# Patient Record
Sex: Female | Born: 1955
Health system: Southern US, Community
[De-identification: ages and names within clinical notes are randomized; demographics above are authoritative.]

## PROBLEM LIST (undated history)

## (undated) DIAGNOSIS — D219 Benign neoplasm of connective and other soft tissue, unspecified: Secondary | ICD-10-CM

## (undated) DIAGNOSIS — Z98811 Dental restoration status: Secondary | ICD-10-CM

## (undated) DIAGNOSIS — T7840XA Allergy, unspecified, initial encounter: Secondary | ICD-10-CM

## (undated) DIAGNOSIS — R7303 Prediabetes: Secondary | ICD-10-CM

## (undated) DIAGNOSIS — R011 Cardiac murmur, unspecified: Secondary | ICD-10-CM

## (undated) DIAGNOSIS — E785 Hyperlipidemia, unspecified: Secondary | ICD-10-CM

## (undated) DIAGNOSIS — D649 Anemia, unspecified: Secondary | ICD-10-CM

## (undated) DIAGNOSIS — D170 Benign lipomatous neoplasm of skin and subcutaneous tissue of head, face and neck: Secondary | ICD-10-CM

## (undated) HISTORY — DX: Allergy, unspecified, initial encounter: T78.40XA

## (undated) HISTORY — DX: Benign neoplasm of connective and other soft tissue, unspecified: D21.9

## (undated) HISTORY — DX: Cardiac murmur, unspecified: R01.1

## (undated) HISTORY — DX: Hyperlipidemia, unspecified: E78.5

## (undated) HISTORY — PX: CERVICAL POLYPECTOMY: SHX88

## (undated) HISTORY — PX: CHOLECYSTECTOMY: SHX55

## (undated) HISTORY — PX: COLONOSCOPY: SHX174

---

## 1988-08-03 HISTORY — PX: TUBAL LIGATION: SHX77

## 1999-04-01 ENCOUNTER — Other Ambulatory Visit: Admission: RE | Admit: 1999-04-01 | Discharge: 1999-04-01 | Payer: Self-pay | Admitting: Obstetrics and Gynecology

## 1999-08-18 ENCOUNTER — Encounter: Admission: RE | Admit: 1999-08-18 | Discharge: 1999-08-18 | Payer: Self-pay | Admitting: Obstetrics and Gynecology

## 1999-08-18 ENCOUNTER — Encounter: Payer: Self-pay | Admitting: Obstetrics and Gynecology

## 2000-12-08 ENCOUNTER — Encounter: Payer: Self-pay | Admitting: Obstetrics and Gynecology

## 2000-12-08 ENCOUNTER — Encounter: Admission: RE | Admit: 2000-12-08 | Discharge: 2000-12-08 | Payer: Self-pay | Admitting: Obstetrics and Gynecology

## 2000-12-15 ENCOUNTER — Other Ambulatory Visit: Admission: RE | Admit: 2000-12-15 | Discharge: 2000-12-15 | Payer: Self-pay | Admitting: Obstetrics and Gynecology

## 2002-03-01 ENCOUNTER — Encounter: Payer: Self-pay | Admitting: Obstetrics and Gynecology

## 2002-03-01 ENCOUNTER — Encounter: Admission: RE | Admit: 2002-03-01 | Discharge: 2002-03-01 | Payer: Self-pay | Admitting: Obstetrics and Gynecology

## 2002-07-03 ENCOUNTER — Other Ambulatory Visit: Admission: RE | Admit: 2002-07-03 | Discharge: 2002-07-03 | Payer: Self-pay | Admitting: Internal Medicine

## 2003-09-11 ENCOUNTER — Other Ambulatory Visit: Admission: RE | Admit: 2003-09-11 | Discharge: 2003-09-11 | Payer: Self-pay | Admitting: Obstetrics and Gynecology

## 2003-09-19 ENCOUNTER — Encounter: Admission: RE | Admit: 2003-09-19 | Discharge: 2003-09-19 | Payer: Self-pay | Admitting: Obstetrics and Gynecology

## 2004-11-07 ENCOUNTER — Other Ambulatory Visit: Admission: RE | Admit: 2004-11-07 | Discharge: 2004-11-07 | Payer: Self-pay | Admitting: Obstetrics and Gynecology

## 2005-01-19 ENCOUNTER — Encounter: Admission: RE | Admit: 2005-01-19 | Discharge: 2005-01-19 | Payer: Self-pay | Admitting: Obstetrics and Gynecology

## 2006-02-08 ENCOUNTER — Encounter: Admission: RE | Admit: 2006-02-08 | Discharge: 2006-02-08 | Payer: Self-pay | Admitting: Obstetrics and Gynecology

## 2006-03-30 ENCOUNTER — Other Ambulatory Visit: Admission: RE | Admit: 2006-03-30 | Discharge: 2006-03-30 | Payer: Self-pay | Admitting: Obstetrics and Gynecology

## 2006-04-23 ENCOUNTER — Ambulatory Visit: Payer: Self-pay | Admitting: Gastroenterology

## 2006-05-05 ENCOUNTER — Ambulatory Visit: Payer: Self-pay | Admitting: Gastroenterology

## 2007-03-01 ENCOUNTER — Encounter: Admission: RE | Admit: 2007-03-01 | Discharge: 2007-03-01 | Payer: Self-pay | Admitting: Obstetrics and Gynecology

## 2007-04-27 ENCOUNTER — Other Ambulatory Visit: Admission: RE | Admit: 2007-04-27 | Discharge: 2007-04-27 | Payer: Self-pay | Admitting: Obstetrics and Gynecology

## 2008-03-01 ENCOUNTER — Encounter: Admission: RE | Admit: 2008-03-01 | Discharge: 2008-03-01 | Payer: Self-pay | Admitting: Obstetrics and Gynecology

## 2008-04-25 ENCOUNTER — Emergency Department (HOSPITAL_COMMUNITY): Admission: EM | Admit: 2008-04-25 | Discharge: 2008-04-25 | Payer: Self-pay | Admitting: Family Medicine

## 2009-03-12 ENCOUNTER — Encounter: Admission: RE | Admit: 2009-03-12 | Discharge: 2009-03-12 | Payer: Self-pay | Admitting: Obstetrics and Gynecology

## 2009-10-24 ENCOUNTER — Telehealth (INDEPENDENT_AMBULATORY_CARE_PROVIDER_SITE_OTHER): Payer: Self-pay | Admitting: *Deleted

## 2010-03-14 ENCOUNTER — Encounter: Admission: RE | Admit: 2010-03-14 | Discharge: 2010-03-14 | Payer: Self-pay | Admitting: Obstetrics and Gynecology

## 2010-09-04 NOTE — Progress Notes (Signed)
  Phone Note Other Incoming   Summary of Call: Request for records from [facility] received on March.24 forwarded to [facility].

## 2011-02-26 ENCOUNTER — Other Ambulatory Visit: Payer: Self-pay | Admitting: Obstetrics and Gynecology

## 2011-02-26 DIAGNOSIS — Z1231 Encounter for screening mammogram for malignant neoplasm of breast: Secondary | ICD-10-CM

## 2011-03-19 ENCOUNTER — Ambulatory Visit
Admission: RE | Admit: 2011-03-19 | Discharge: 2011-03-19 | Disposition: A | Discharge status: Home / Self Care | Payer: 59 | Source: Ambulatory Visit | Attending: Obstetrics and Gynecology | Admitting: Obstetrics and Gynecology

## 2011-03-19 DIAGNOSIS — Z1231 Encounter for screening mammogram for malignant neoplasm of breast: Secondary | ICD-10-CM

## 2012-03-29 ENCOUNTER — Other Ambulatory Visit: Payer: Self-pay | Admitting: Obstetrics and Gynecology

## 2012-03-29 DIAGNOSIS — Z1231 Encounter for screening mammogram for malignant neoplasm of breast: Secondary | ICD-10-CM

## 2012-04-21 ENCOUNTER — Ambulatory Visit
Admission: RE | Admit: 2012-04-21 | Discharge: 2012-04-21 | Disposition: A | Discharge status: Home / Self Care | Payer: 59 | Source: Ambulatory Visit | Attending: Obstetrics and Gynecology | Admitting: Obstetrics and Gynecology

## 2012-04-21 DIAGNOSIS — Z1231 Encounter for screening mammogram for malignant neoplasm of breast: Secondary | ICD-10-CM

## 2012-08-03 DIAGNOSIS — D170 Benign lipomatous neoplasm of skin and subcutaneous tissue of head, face and neck: Secondary | ICD-10-CM

## 2012-08-03 HISTORY — DX: Benign lipomatous neoplasm of skin and subcutaneous tissue of head, face and neck: D17.0

## 2012-08-23 ENCOUNTER — Encounter (HOSPITAL_BASED_OUTPATIENT_CLINIC_OR_DEPARTMENT_OTHER): Payer: Self-pay | Admitting: *Deleted

## 2012-08-25 NOTE — H&P (Signed)
PREOPERATIVE H&P  Chief Complaint: right neck mass  HPI: Donna Le is a 57 y.o. female who presents for evaluation of right neck mass she's had for years. It's gradually gotten a little larger and measures approximately 4 x 8 cm in size and is consistent with a lipoma. She's taken to the OR for excision of right neck lipoma.   Past Medical History  Diagnosis Date  . Anemia     takes iron supplement  . Dental crowns present   . Lipoma of neck 08/2012    right   Past Surgical History  Procedure Date  . Cholecystectomy   . Tubal ligation    History   Social History  . Marital Status: Married    Spouse Name: N/A    Number of Children: N/A  . Years of Education: N/A   Social History Main Topics  . Smoking status: Never Smoker   . Smokeless tobacco: Never Used  . Alcohol Use: No  . Drug Use: No  . Sexually Active:    Other Topics Concern  . None   Social History Narrative  . None   Family History  Problem Relation Age of Onset  . Anesthesia problems Sister     hard to wake up post-op   No Known Allergies Prior to Admission medications   Medication Sig Start Date End Date Taking? Authorizing Provider  ferrous sulfate 325 (65 FE) MG tablet Take 325 mg by mouth daily with breakfast.   Yes Historical Provider, MD  Multiple Vitamin (MULTIVITAMIN) tablet Take 1 tablet by mouth daily.   Yes Historical Provider, MD     Positive ROS: right neck mass otherwise asymptomatic  All other systems have been reviewed and were otherwise negative with the exception of those mentioned in the HPI and as above.  Physical Exam: There were no vitals filed for this visit.  General: Alert, no acute distress Oral: Normal oral mucosa and tonsils Nasal: Clear nasal passages Neck: No palpable adenopathy or thyroid nodules. 4 x 8 cm right neck mass consistent with a large lipoma Ear: Ear canal is clear with normal appearing TMs Cardiovascular: Regular rate and rhythm, no murmur.    Respiratory: Clear to auscultation Neurologic: Alert and oriented x 3   Assessment/Plan: RIGHT NECK LIPOMA Plan for Procedure(s): EXCISION LIPOMA   Dillard Cannon, MD 08/25/2012 4:05 PM  2

## 2012-08-26 ENCOUNTER — Ambulatory Visit (HOSPITAL_BASED_OUTPATIENT_CLINIC_OR_DEPARTMENT_OTHER)
Admission: RE | Admit: 2012-08-26 | Discharge: 2012-08-26 | Disposition: A | Discharge status: Home / Self Care | Payer: 59 | Source: Ambulatory Visit | Attending: Otolaryngology | Admitting: Otolaryngology

## 2012-08-26 ENCOUNTER — Encounter (HOSPITAL_BASED_OUTPATIENT_CLINIC_OR_DEPARTMENT_OTHER): Payer: Self-pay | Admitting: Anesthesiology

## 2012-08-26 ENCOUNTER — Encounter (HOSPITAL_BASED_OUTPATIENT_CLINIC_OR_DEPARTMENT_OTHER)
Admission: RE | Disposition: A | Discharge status: Home / Self Care | Payer: Self-pay | Source: Ambulatory Visit | Attending: Otolaryngology

## 2012-08-26 ENCOUNTER — Encounter (HOSPITAL_BASED_OUTPATIENT_CLINIC_OR_DEPARTMENT_OTHER): Payer: Self-pay | Admitting: *Deleted

## 2012-08-26 ENCOUNTER — Ambulatory Visit (HOSPITAL_BASED_OUTPATIENT_CLINIC_OR_DEPARTMENT_OTHER): Payer: 59 | Admitting: Anesthesiology

## 2012-08-26 DIAGNOSIS — D1739 Benign lipomatous neoplasm of skin and subcutaneous tissue of other sites: Secondary | ICD-10-CM | POA: Insufficient documentation

## 2012-08-26 HISTORY — DX: Dental restoration status: Z98.811

## 2012-08-26 HISTORY — DX: Anemia, unspecified: D64.9

## 2012-08-26 HISTORY — DX: Benign lipomatous neoplasm of skin and subcutaneous tissue of head, face and neck: D17.0

## 2012-08-26 HISTORY — PX: LIPOMA EXCISION: SHX5283

## 2012-08-26 SURGERY — EXCISION LIPOMA
Anesthesia: General | Site: Neck | Laterality: Right | Wound class: Clean

## 2012-08-26 MED ORDER — MIDAZOLAM HCL 2 MG/ML PO SYRP: 12.0000 mg | ORAL_SOLUTION | Freq: Once | ORAL | Status: DC | PRN

## 2012-08-26 MED ORDER — HYDROCODONE-ACETAMINOPHEN 5-500 MG PO TABS: 1.0000 | ORAL_TABLET | Freq: Four times a day (QID) | ORAL | Status: DC | PRN

## 2012-08-26 MED ORDER — DEXAMETHASONE SODIUM PHOSPHATE 4 MG/ML IJ SOLN
INTRAMUSCULAR | Status: DC | PRN
  Administered 2012-08-26: 10 mg via INTRAVENOUS

## 2012-08-26 MED ORDER — LIDOCAINE-EPINEPHRINE 1 %-1:100000 IJ SOLN
INTRAMUSCULAR | Status: DC | PRN
  Administered 2012-08-26: 7 mL

## 2012-08-26 MED ORDER — PROPOFOL 10 MG/ML IV BOLUS
INTRAVENOUS | Status: DC | PRN
  Administered 2012-08-26: 200 mg via INTRAVENOUS

## 2012-08-26 MED ORDER — ONDANSETRON HCL 4 MG/2ML IJ SOLN
INTRAMUSCULAR | Status: DC | PRN
  Administered 2012-08-26: 4 mg via INTRAVENOUS

## 2012-08-26 MED ORDER — BACITRACIN ZINC 500 UNIT/GM EX OINT
TOPICAL_OINTMENT | CUTANEOUS | Status: DC | PRN
  Administered 2012-08-26: 1 via TOPICAL

## 2012-08-26 MED ORDER — FENTANYL CITRATE 0.05 MG/ML IJ SOLN: 50.0000 ug | INTRAMUSCULAR | Status: DC | PRN

## 2012-08-26 MED ORDER — FENTANYL CITRATE 0.05 MG/ML IJ SOLN
INTRAMUSCULAR | Status: DC | PRN
  Administered 2012-08-26: 100 ug via INTRAVENOUS

## 2012-08-26 MED ORDER — MIDAZOLAM HCL 5 MG/5ML IJ SOLN
INTRAMUSCULAR | Status: DC | PRN
  Administered 2012-08-26: 2 mg via INTRAVENOUS

## 2012-08-26 MED ORDER — LIDOCAINE HCL (CARDIAC) 20 MG/ML IV SOLN
INTRAVENOUS | Status: DC | PRN
  Administered 2012-08-26: 70 mg via INTRAVENOUS

## 2012-08-26 MED ORDER — MIDAZOLAM HCL 2 MG/2ML IJ SOLN: 1.0000 mg | INTRAMUSCULAR | Status: DC | PRN

## 2012-08-26 MED ORDER — LACTATED RINGERS IV SOLN
INTRAVENOUS | Status: DC
  Administered 2012-08-26 (×2): via INTRAVENOUS

## 2012-08-26 MED ORDER — OXYCODONE HCL 5 MG/5ML PO SOLN: 5.0000 mg | Freq: Once | ORAL | Status: DC | PRN

## 2012-08-26 MED ORDER — EPHEDRINE SULFATE 50 MG/ML IJ SOLN
INTRAMUSCULAR | Status: DC | PRN
  Administered 2012-08-26 (×2): 10 mg via INTRAVENOUS

## 2012-08-26 MED ORDER — OXYCODONE HCL 5 MG PO TABS: 5.0000 mg | ORAL_TABLET | Freq: Once | ORAL | Status: DC | PRN

## 2012-08-26 MED ORDER — HYDROMORPHONE HCL PF 1 MG/ML IJ SOLN: 0.2500 mg | INTRAMUSCULAR | Status: DC | PRN

## 2012-08-26 MED ORDER — ONDANSETRON HCL 4 MG/2ML IJ SOLN: 4.0000 mg | Freq: Once | INTRAMUSCULAR | Status: DC | PRN

## 2012-08-26 MED ORDER — CEFAZOLIN SODIUM-DEXTROSE 2-3 GM-% IV SOLR
INTRAVENOUS | Status: DC | PRN
  Administered 2012-08-26: 2 g via INTRAVENOUS

## 2012-08-26 SURGICAL SUPPLY — 60 items
ADH SKN CLS APL DERMABOND .7 (GAUZE/BANDAGES/DRESSINGS)
APL SKNCLS STERI-STRIP NONHPOA (GAUZE/BANDAGES/DRESSINGS)
BENZOIN TINCTURE PRP APPL 2/3 (GAUZE/BANDAGES/DRESSINGS) IMPLANT
BLADE SURG 15 STRL LF DISP TIS (BLADE) ×1 IMPLANT
BLADE SURG 15 STRL SS (BLADE) ×2
CANISTER SUCTION 1200CC (MISCELLANEOUS) ×2 IMPLANT
CLEANER CAUTERY TIP 5X5 PAD (MISCELLANEOUS) IMPLANT
CLOTH BEACON ORANGE TIMEOUT ST (SAFETY) ×2 IMPLANT
CORDS BIPOLAR (ELECTRODE) ×1 IMPLANT
COVER MAYO STAND STRL (DRAPES) ×2 IMPLANT
COVER TABLE BACK 60X90 (DRAPES) ×2 IMPLANT
DECANTER SPIKE VIAL GLASS SM (MISCELLANEOUS) IMPLANT
DERMABOND ADVANCED (GAUZE/BANDAGES/DRESSINGS)
DERMABOND ADVANCED .7 DNX12 (GAUZE/BANDAGES/DRESSINGS) IMPLANT
DRAPE U-SHAPE 76X120 STRL (DRAPES) ×2 IMPLANT
ELECT COATED BLADE 2.86 ST (ELECTRODE) ×2 IMPLANT
ELECT REM PT RETURN 9FT ADLT (ELECTROSURGICAL) ×2
ELECTRODE REM PT RTRN 9FT ADLT (ELECTROSURGICAL) ×1 IMPLANT
GAUZE SPONGE 4X4 12PLY STRL LF (GAUZE/BANDAGES/DRESSINGS) ×2 IMPLANT
GAUZE SPONGE 4X4 16PLY XRAY LF (GAUZE/BANDAGES/DRESSINGS) IMPLANT
GLOVE ECLIPSE 6.5 STRL STRAW (GLOVE) ×3 IMPLANT
GLOVE INDICATOR 7.0 STRL GRN (GLOVE) ×2 IMPLANT
GLOVE SS BIOGEL STRL SZ 7.5 (GLOVE) ×1 IMPLANT
GLOVE SUPERSENSE BIOGEL SZ 7.5 (GLOVE) ×1
GOWN PREVENTION PLUS XLARGE (GOWN DISPOSABLE) ×2 IMPLANT
HEMOSTAT SURGICEL .5X2 ABSORB (HEMOSTASIS) IMPLANT
LOCATOR NERVE 3 VOLT (DISPOSABLE) IMPLANT
NDL HYPO 25X1 1.5 SAFETY (NEEDLE) ×1 IMPLANT
NEEDLE HYPO 25X1 1.5 SAFETY (NEEDLE) ×2 IMPLANT
NS IRRIG 1000ML POUR BTL (IV SOLUTION) ×2 IMPLANT
PACK BASIN DAY SURGERY FS (CUSTOM PROCEDURE TRAY) ×2 IMPLANT
PAD CLEANER CAUTERY TIP 5X5 (MISCELLANEOUS) ×1
PENCIL BUTTON HOLSTER BLD 10FT (ELECTRODE) ×2 IMPLANT
SLEEVE SCD COMPRESS KNEE MED (MISCELLANEOUS) ×2 IMPLANT
SPONGE INTESTINAL PEANUT (DISPOSABLE) ×1 IMPLANT
STRIP CLOSURE SKIN 1/2X4 (GAUZE/BANDAGES/DRESSINGS) IMPLANT
STRIP CLOSURE SKIN 1/4X4 (GAUZE/BANDAGES/DRESSINGS) IMPLANT
SUCTION FRAZIER TIP 10 FR DISP (SUCTIONS) IMPLANT
SUT CHROMIC 3 0 PS 2 (SUTURE) ×1 IMPLANT
SUT CHROMIC 3 0 SH 27 (SUTURE) ×1 IMPLANT
SUT CHROMIC 3 0 TIES (SUTURE) IMPLANT
SUT ETHILON 4 0 PS 2 18 (SUTURE) IMPLANT
SUT ETHILON 5 0 P 3 18 (SUTURE) ×1
SUT NYLON ETHILON 5-0 P-3 1X18 (SUTURE) ×1 IMPLANT
SUT SILK 2 0 TIES 17X18 (SUTURE)
SUT SILK 2-0 18XBRD TIE BLK (SUTURE) IMPLANT
SUT SILK 3 0 SH 30 (SUTURE) IMPLANT
SUT SILK 3 0 TIES 17X18 (SUTURE) ×2
SUT SILK 3-0 18XBRD TIE BLK (SUTURE) ×1 IMPLANT
SUT VIC AB 5-0 P-3 18X BRD (SUTURE) IMPLANT
SUT VIC AB 5-0 P3 18 (SUTURE)
SWAB COLLECTION DEVICE MRSA (MISCELLANEOUS) IMPLANT
SYR BULB 3OZ (MISCELLANEOUS) ×2 IMPLANT
SYR CONTROL 10ML LL (SYRINGE) ×2 IMPLANT
TAPE HYPAFIX 4 X10 (GAUZE/BANDAGES/DRESSINGS) ×1 IMPLANT
TOWEL OR 17X24 6PK STRL BLUE (TOWEL DISPOSABLE) ×4 IMPLANT
TRAY DSU PREP LF (CUSTOM PROCEDURE TRAY) ×2 IMPLANT
TUBE ANAEROBIC SPECIMEN COL (MISCELLANEOUS) IMPLANT
TUBE CONNECTING 20X1/4 (TUBING) ×2 IMPLANT
WATER STERILE IRR 1000ML POUR (IV SOLUTION) IMPLANT

## 2012-08-26 NOTE — Anesthesia Postprocedure Evaluation (Signed)
  Anesthesia Post-op Note  Patient: Donna Le  Procedure(s) Performed: Procedure(s) (LRB) with comments: EXCISION LIPOMA (Right)  Patient Location: PACU  Anesthesia Type:General  Level of Consciousness: awake, alert  and oriented  Airway and Oxygen Therapy: Patient Spontanous Breathing and Patient connected to face mask oxygen  Post-op Pain: mild  Post-op Assessment: Post-op Vital signs reviewed  Post-op Vital Signs: Reviewed  Complications: No apparent anesthesia complications

## 2012-08-26 NOTE — Brief Op Note (Signed)
08/26/2012  9:44 AM  PATIENT:  Star Age  57 y.o. female  PRE-OPERATIVE DIAGNOSIS:  RIGHT NECK LIPOMA  POST-OPERATIVE DIAGNOSIS:  RIGHT NECK LIPOMA  PROCEDURE:  Procedure(s) (LRB) with comments: EXCISION LIPOMA (Right)  SURGEON:  Surgeon(s) and Role:    * Drema Halon, MD - Primary  PHYSICIAN ASSISTANT:   ASSISTANTS: none   ANESTHESIA:   general  EBL:  Total I/O In: 1300 [I.V.:1300] Out: -   BLOOD ADMINISTERED:none  DRAINS: none   LOCAL MEDICATIONS USED:  Amount: 7 ml xylocaine with epi  SPECIMEN:  Source of Specimen:  right neck lipoma  DISPOSITION OF SPECIMEN:  PATHOLOGY  COUNTS:  YES  TOURNIQUET:  * No tourniquets in log *  DICTATION: .Other Dictation: Dictation Number [id number]  PLAN OF CARE: Discharge to home after PACU  PATIENT DISPOSITION:  PACU - hemodynamically stable.   Delay start of Pharmacological VTE agent (>24hrs) due to surgical blood loss or risk of bleeding: yes

## 2012-08-26 NOTE — Anesthesia Preprocedure Evaluation (Signed)
Anesthesia Evaluation  Patient identified by MRN, date of birth, ID band Patient awake    Reviewed: Allergy & Precautions, H&P , NPO status , Patient's Chart, lab work & pertinent test results  Airway Mallampati: I TM Distance: >3 FB Neck ROM: Full    Dental  (+) Teeth Intact and Dental Advisory Given   Pulmonary  breath sounds clear to auscultation        Cardiovascular Rhythm:Regular Rate:Normal     Neuro/Psych    GI/Hepatic   Endo/Other    Renal/GU      Musculoskeletal   Abdominal   Peds  Hematology   Anesthesia Other Findings   Reproductive/Obstetrics                           Anesthesia Physical Anesthesia Plan  ASA: I  Anesthesia Plan: General   Post-op Pain Management:    Induction: Intravenous  Airway Management Planned:   Additional Equipment:   Intra-op Plan:   Post-operative Plan: Extubation in OR  Informed Consent: I have reviewed the patients History and Physical, chart, labs and discussed the procedure including the risks, benefits and alternatives for the proposed anesthesia with the patient or authorized representative who has indicated his/her understanding and acceptance.   Dental advisory given  Plan Discussed with: CRNA, Anesthesiologist and Surgeon  Anesthesia Plan Comments:         Anesthesia Quick Evaluation  

## 2012-08-26 NOTE — Interval H&P Note (Signed)
History and Physical Interval Note:  08/26/2012 8:41 AM  Donna Le  has presented today for surgery, with the diagnosis of RIGHT NECK LIPOMA  The various methods of treatment have been discussed with the patient and family. After consideration of risks, benefits and other options for treatment, the patient has consented to  Procedure(s) (LRB) with comments: EXCISION LIPOMA (Right) as a surgical intervention .  The patient's history has been reviewed, patient examined, no change in status, stable for surgery.  I have reviewed the patient's chart and labs.  Questions were answered to the patient's satisfaction.     Herta Hink

## 2012-08-26 NOTE — Transfer of Care (Signed)
Immediate Anesthesia Transfer of Care Note  Patient: Donna Le  Procedure(s) Performed: Procedure(s) (LRB) with comments: EXCISION LIPOMA (Right)  Patient Location: PACU  Anesthesia Type:General  Level of Consciousness: sedated  Airway & Oxygen Therapy: Patient Spontanous Breathing and Patient connected to face mask oxygen  Post-op Assessment: Report given to PACU RN and Post -op Vital signs reviewed and stable  Post vital signs: Reviewed and stable  Complications: No apparent anesthesia complications

## 2012-08-29 ENCOUNTER — Encounter (HOSPITAL_BASED_OUTPATIENT_CLINIC_OR_DEPARTMENT_OTHER): Payer: Self-pay | Admitting: Otolaryngology

## 2012-08-29 NOTE — Op Note (Signed)
NAME:  Donna Le, Donna Le              ACCOUNT NO.:  [id number]  MEDICAL RECORD NO.:  [id number]  LOCATION:                                 FACILITY:  PHYSICIAN:  Kristine Garbe. Ezzard Standing, M.D.DATE OF BIRTH:  Oct 22, 1955  DATE OF PROCEDURE:  08/26/2012 DATE OF DISCHARGE:                              OPERATIVE REPORT   PREOPERATIVE DIAGNOSIS:  Large right neck lipoma 4 x 5 x 10 cm.  POSTOPERATIVE DIAGNOSIS:  Large right neck lipoma 4 x 5 x 10 cm.  OPERATION:  Excision of right neck lipoma with intermediate skin closure.  SURGEON:  Kristine Garbe. Ezzard Standing, M.D.  ANESTHETIC:  General LMA.  COMPLICATIONS:  None.  BRIEF CLINICAL:  Donna Le is a 57 year old female, who has had a large right neck mass for number of years.  It has gradually got a little bit larger.  On exam, she has a mass measuring approximately 4-5 cm x 11 cm in size.  It is soft, consistent with a large right lower neck lipoma.  She was taken to the operating room for excision of a large right neck lipoma.  DESCRIPTION OF PROCEDURE:  After adequate general anesthetic with LMA, the patient received 1 g Ancef IV preoperatively.  Neck was prepped with Betadine solution and draped out with sterile towels.  The lipoma was marked out and planned incision site was marked at the lower of the lipoma.  The incision was made down through the skin and subcutaneous tissue.  The lipoma was encountered and the lipoma was dissected out of the subcutaneous tissue.  Hemostasis was obtained with the bipolar cautery to the feeding vessels.  The lipoma was excised and sent to pathology.  After obtaining adequate hemostasis, the wound was irrigated with saline.  The defect was then closed with 3-0 chromic sutures subcutaneously and 5-0 nylon to reapproximate the skin edges.  Pressure dressing was applied.  The patient was awoke from anesthesia and transferred to recovery room, postop doing well.  DISPOSITION:  Donna Le is  discharged home later this morning on Tylenol and Vicodin p.r.n. pain.  I will follow up in my office in 6-7 days for recheck to review pathology and have sutures removed.         ______________________________ Kristine Garbe Ezzard Standing, M.D.    CEN/MEDQ  D:  08/26/2012  T:  08/26/2012  Job:  [id number]

## 2013-04-24 ENCOUNTER — Encounter: Payer: Self-pay | Admitting: Gynecology

## 2013-04-24 ENCOUNTER — Ambulatory Visit (INDEPENDENT_AMBULATORY_CARE_PROVIDER_SITE_OTHER): Payer: 59 | Admitting: Gynecology

## 2013-04-24 ENCOUNTER — Ambulatory Visit: Payer: Self-pay | Admitting: Obstetrics and Gynecology

## 2013-04-24 VITALS — BP 138/88 | HR 60 | Resp 16 | Ht 65.5 in | Wt 219.0 lb

## 2013-04-24 DIAGNOSIS — Z124 Encounter for screening for malignant neoplasm of cervix: Secondary | ICD-10-CM

## 2013-04-24 DIAGNOSIS — Z01419 Encounter for gynecological examination (general) (routine) without abnormal findings: Secondary | ICD-10-CM

## 2013-04-24 NOTE — Progress Notes (Signed)
57 y.o. Married [place] American female   [phone] here for annual exam. Pt reports menses are absent. She does report hot flashes, does have night sweats, does have vaginal dryness.  She is using lubricants, occassionally.  She does not report post-menopasual bleeding.  Pt states hot flashes are getting better, not bothersome.    No LMP recorded. Patient is postmenopausal.          Sexually active: yes  The current method of family planning is post menopausal status.    Exercising: no  The patient does not participate in regular exercise at present. Last pap: 02/05/2010  Abnormal PAP: no  Mammogram: 04/21/2012 BSE: yes Colonoscopy:8 years ago  Normal f/u in 10 years DEXA: never had one  Alcohol: no Tobacco: no  Hgb: PCP ; Urine   No health maintenance topics applied.  Family History  Problem Relation Age of Onset  . Anesthesia problems Sister     hard to wake up post-op  . Breast cancer Mother   . Diabetes Paternal Grandmother     There are no active problems to display for this patient.   Past Medical History  Diagnosis Date  . Anemia     takes iron supplement  . Dental crowns present   . Lipoma of neck 08/2012    right  . Fibroid     uterus    Past Surgical History  Procedure Laterality Date  . Cholecystectomy    . Lipoma excision  08/26/2012    Procedure: EXCISION LIPOMA;  Surgeon: Drema Halon, MD;  Location: [facility];  Service: ENT;  Laterality: Right;  . Tubal ligation  1990    Allergies: Review of patient's allergies indicates no known allergies.  Current Outpatient Prescriptions  Medication Sig Dispense Refill  . ferrous sulfate 325 (65 FE) MG tablet Take 325 mg by mouth daily with breakfast.      . Multiple Vitamin (MULTIVITAMIN) tablet Take 1 tablet by mouth daily.       No current facility-administered medications for this visit.    ROS: Pertinent items are noted in HPI.  Exam:    BP 138/88  Pulse 60  Resp 16  Ht 5'  5.5" (1.664 m)  Wt 219 lb (99.338 kg)  BMI 35.88 kg/m2 Weight change: @WEIGHTCHANGE @ Last 3 height recordings:  Ht Readings from Last 3 Encounters:  04/24/13 5' 5.5" (1.664 m)  08/26/12 5\' 5"  (1.651 m)  08/26/12 5\' 5"  (1.651 m)   General appearance: alert, cooperative and appears stated age Head: Normocephalic, without obvious abnormality, atraumatic Neck: no adenopathy, no carotid bruit, no JVD, supple, symmetrical, trachea midline and thyroid not enlarged, symmetric, no tenderness/mass/nodules Lungs: clear to auscultation bilaterally Breasts: normal appearance, no masses or tenderness Heart: regular rate and rhythm, holosystolic flow murmur Abdomen: soft, non-tender; bowel sounds normal; no masses,  no organomegaly Extremities: extremities normal, atraumatic, no cyanosis or edema Skin: Skin color, texture, turgor normal. No rashes or lesions Lymph nodes: Cervical, supraclavicular, and axillary nodes normal. no inguinal nodes palpated Neurologic: Grossly normal   Pelvic: External genitalia:  no lesions              Urethra: normal appearing urethra with no masses, tenderness or lesions              Bartholins and Skenes: normal                 Vagina: normal appearing vagina with normal color and discharge, no lesions  Cervix: normal appearance              Pap taken: yes        Bimanual Exam:  Uterus:  enlarged to 12 week's size, irregular posterior                                      Adnexa:    no masses                                      Rectovaginal: Confirms                                      Anus:  normal sphincter tone, no lesions  A: well woman Known fibroid uterus     P: mammogram pap smear with HRHPV Fibroid uterus-no issues, ok to watch counseled on breast self exam, mammography screening, menopause, adequate intake of calcium and vitamin D, diet and exercise return annually or prn Discussed PAP guideline changes, importance of weight bearing  exercises, calcium, vit D and balanced diet.  An After Visit Summary was printed and given to the patient.

## 2013-04-24 NOTE — Patient Instructions (Signed)

## 2013-04-28 ENCOUNTER — Other Ambulatory Visit: Payer: Self-pay

## 2013-04-28 DIAGNOSIS — Z1231 Encounter for screening mammogram for malignant neoplasm of breast: Secondary | ICD-10-CM

## 2013-05-31 ENCOUNTER — Ambulatory Visit
Admission: RE | Admit: 2013-05-31 | Discharge: 2013-05-31 | Disposition: A | Discharge status: Home / Self Care | Payer: 59 | Source: Ambulatory Visit

## 2013-05-31 DIAGNOSIS — Z1231 Encounter for screening mammogram for malignant neoplasm of breast: Secondary | ICD-10-CM

## 2014-04-10 ENCOUNTER — Encounter: Payer: Self-pay | Admitting: Gynecology

## 2014-04-25 ENCOUNTER — Ambulatory Visit: Payer: 59 | Admitting: Gynecology

## 2014-05-02 ENCOUNTER — Ambulatory Visit: Payer: 59 | Admitting: Gynecology

## 2014-05-07 ENCOUNTER — Ambulatory Visit (INDEPENDENT_AMBULATORY_CARE_PROVIDER_SITE_OTHER): Payer: 59 | Admitting: Gynecology

## 2014-05-07 ENCOUNTER — Encounter: Payer: Self-pay | Admitting: Gynecology

## 2014-05-07 VITALS — BP 120/76 | HR 72 | Resp 16 | Ht 65.75 in | Wt 226.0 lb

## 2014-05-07 DIAGNOSIS — Z01419 Encounter for gynecological examination (general) (routine) without abnormal findings: Secondary | ICD-10-CM

## 2014-05-07 DIAGNOSIS — Z0000 Encounter for general adult medical examination without abnormal findings: Secondary | ICD-10-CM

## 2014-05-07 DIAGNOSIS — D251 Intramural leiomyoma of uterus: Secondary | ICD-10-CM

## 2014-05-07 LAB — HEMOGLOBIN, FINGERSTICK: Hemoglobin, fingerstick: 13.3 g/dL (ref 12.0–16.0)

## 2014-05-07 LAB — POCT URINALYSIS DIPSTICK
Leukocytes, UA: NEGATIVE
PH UA: 5
Urobilinogen, UA: NEGATIVE

## 2014-05-07 NOTE — Progress Notes (Signed)
58 y.o. Married [place] American female   [phone] here for annual exam. Pt reports menses are absent due to Menopause. She does not report hot flashes, occasionally have night sweats, does have vaginal dryness.  She is using lubricants, OTC.  She does not report post-menopasual bleeding  Patient's last menstrual period was 08/03/2005.          Sexually active: Yes.    The current method of family planning is post menopausal status.    Exercising: Yes.    walking qd, exercise 1-2x/wk Last pap: 04/24/13 NEG HPV Abnormal PAP: no Mammogram: 06/06/13 Bi-Rads 1 BSE: occasionally  Colonoscopy: 05/2006 Normal- f/u in 10 years ago DEXA: never had one Alcohol: no Tobacco: no  Hgb: 13.3 ; Urine: Negative  Health Maintenance  Topic Date Due  . Tetanus/tdap  03/22/1974  . Colonoscopy  03/22/2005  . Influenza Vaccine  03/03/2014  . Mammogram  06/01/2015  . Pap Smear  04/24/2016    Family History  Problem Relation Age of Onset  . Anesthesia problems Sister     hard to wake up post-op  . Breast cancer Mother   . Diabetes Paternal Grandmother   . Diabetes Maternal Grandmother     There are no active problems to display for this patient.   Past Medical History  Diagnosis Date  . Anemia     takes iron supplement  . Dental crowns present   . Lipoma of neck 08/2012    right  . Fibroid     uterus    Past Surgical History  Procedure Laterality Date  . Cholecystectomy    . Lipoma excision  08/26/2012    Procedure: EXCISION LIPOMA;  Surgeon: Rozetta Nunnery, MD;  Location: House;  Service: ENT;  Laterality: Right;  . Tubal ligation  1990    Allergies: Review of patient's allergies indicates no known allergies.  Current Outpatient Prescriptions  Medication Sig Dispense Refill  . ferrous sulfate 325 (65 FE) MG tablet Take 325 mg by mouth daily with breakfast.      . Multiple Vitamin (MULTIVITAMIN) tablet Take 1 tablet by mouth daily.       No current  facility-administered medications for this visit.    ROS: Pertinent items are noted in HPI.  Exam:    BP 120/76  Pulse 72  Resp 16  Ht 5' 5.75" (1.67 m)  Wt 226 lb (102.513 kg)  BMI 36.76 kg/m2  LMP 08/03/2005 Weight change: @WEIGHTCHANGE @ Last 3 height recordings:  Ht Readings from Last 3 Encounters:  05/07/14 5' 5.75" (1.67 m)  04/24/13 5' 5.5" (1.664 m)  08/26/12 5\' 5"  (1.651 m)   General appearance: alert, cooperative and appears stated age Head: Normocephalic, without obvious abnormality, atraumatic Neck: no adenopathy, no carotid bruit, no JVD, supple, symmetrical, trachea midline and thyroid not enlarged, symmetric, no tenderness/mass/nodules Lungs: clear to auscultation bilaterally Breasts: Inspection negative, No nipple retraction or dimpling, No nipple discharge or bleeding, No axillary or supraclavicular adenopathy, Normal to palpation without dominant masses Heart: regular rate and rhythm, S1, S2 normal, no murmur, click, rub or gallop Abdomen: soft, non-tender; bowel sounds normal; no masses,  no organomegaly Extremities: extremities normal, atraumatic, no cyanosis or edema Skin: Skin color, texture, turgor normal. No rashes or lesions Lymph nodes: Cervical, supraclavicular, and axillary nodes normal. no inguinal nodes palpated Neurologic: Grossly normal   Pelvic: External genitalia:  normal escutcheon              Urethra: normal appearing  urethra with no masses, tenderness or lesions              Bartholins and Skenes: Bartholin's, Urethra, Skene's normal                 Vagina: normal appearing vagina with normal color and discharge, no lesions              Cervix: normal appearance              Pap taken: No.        Bimanual Exam:  Uterus:  uterus is normal size, shape, consistency and nontender, enlarged to 12 week's size, irregular posterior                                      Adnexa:    no masses                                      Rectovaginal:  Confirms                                      Anus:  normal sphincter tone, no lesions       P: .  An After Visit Summary was printed and given to the patient.   1. Laboratory examination ordered as part of a routine general medical examination  - POCT Urinalysis Dipstick - Hemoglobin, fingerstick  2. Encounter for routine gynecological examination mammogram DEXA screening discussed Hb great-can stop iron return annually or prn Discussed PAP guideline changes, importance of weight bearing exercises, calcium, vit D and balanced diet-pt gained 5# this year due to changes in acitivity  3. Intramural leiomyoma of uterus No issues, stable by exam Reviewed affects of menopause on fibroids

## 2014-06-04 ENCOUNTER — Encounter: Payer: Self-pay | Admitting: Gynecology

## 2014-07-28 ENCOUNTER — Emergency Department (HOSPITAL_COMMUNITY)
Admission: EM | Admit: 2014-07-28 | Discharge: 2014-07-28 | Disposition: A | Discharge status: Home / Self Care | Payer: 59 | Attending: Emergency Medicine | Admitting: Emergency Medicine

## 2014-07-28 ENCOUNTER — Emergency Department (HOSPITAL_COMMUNITY): Payer: 59

## 2014-07-28 ENCOUNTER — Encounter (HOSPITAL_COMMUNITY): Payer: Self-pay | Admitting: *Deleted

## 2014-07-28 DIAGNOSIS — Z79899 Other long term (current) drug therapy: Secondary | ICD-10-CM | POA: Diagnosis not present

## 2014-07-28 DIAGNOSIS — R1013 Epigastric pain: Secondary | ICD-10-CM | POA: Diagnosis present

## 2014-07-28 DIAGNOSIS — D649 Anemia, unspecified: Secondary | ICD-10-CM | POA: Insufficient documentation

## 2014-07-28 DIAGNOSIS — K5900 Constipation, unspecified: Secondary | ICD-10-CM | POA: Insufficient documentation

## 2014-07-28 LAB — CBC WITH DIFFERENTIAL/PLATELET
Basophils Absolute: 0 10*3/uL (ref 0.0–0.1)
Basophils Relative: 1 % (ref 0–1)
EOS ABS: 0.1 10*3/uL (ref 0.0–0.7)
EOS PCT: 1 % (ref 0–5)
HCT: 39.6 % (ref 36.0–46.0)
Hemoglobin: 12.9 g/dL (ref 12.0–15.0)
LYMPHS ABS: 2.5 10*3/uL (ref 0.7–4.0)
Lymphocytes Relative: 50 % — ABNORMAL HIGH (ref 12–46)
MCH: 28.6 pg (ref 26.0–34.0)
MCHC: 32.6 g/dL (ref 30.0–36.0)
MCV: 87.8 fL (ref 78.0–100.0)
MONO ABS: 0.4 10*3/uL (ref 0.1–1.0)
MONOS PCT: 8 % (ref 3–12)
Neutro Abs: 2 10*3/uL (ref 1.7–7.7)
Neutrophils Relative %: 40 % — ABNORMAL LOW (ref 43–77)
PLATELETS: 234 10*3/uL (ref 150–400)
RBC: 4.51 MIL/uL (ref 3.87–5.11)
RDW: 13.6 % (ref 11.5–15.5)
WBC: 5 10*3/uL (ref 4.0–10.5)

## 2014-07-28 LAB — COMPREHENSIVE METABOLIC PANEL
ALT: 20 U/L (ref 0–35)
ANION GAP: 5 (ref 5–15)
AST: 20 U/L (ref 0–37)
Albumin: 4.1 g/dL (ref 3.5–5.2)
Alkaline Phosphatase: 64 U/L (ref 39–117)
BUN: 11 mg/dL (ref 6–23)
CALCIUM: 9.6 mg/dL (ref 8.4–10.5)
CO2: 30 mmol/L (ref 19–32)
CREATININE: 0.89 mg/dL (ref 0.50–1.10)
Chloride: 104 mEq/L (ref 96–112)
GFR calc non Af Amer: 70 mL/min — ABNORMAL LOW (ref >90)
GFR, EST AFRICAN AMERICAN: 81 mL/min — AB (ref >90)
GLUCOSE: 104 mg/dL — AB (ref 70–99)
Potassium: 4.2 mmol/L (ref 3.5–5.1)
SODIUM: 139 mmol/L (ref 135–145)
TOTAL PROTEIN: 7.7 g/dL (ref 6.0–8.3)
Total Bilirubin: 0.4 mg/dL (ref 0.3–1.2)

## 2014-07-28 LAB — URINALYSIS, ROUTINE W REFLEX MICROSCOPIC
Bilirubin Urine: NEGATIVE
Glucose, UA: NEGATIVE mg/dL
Hgb urine dipstick: NEGATIVE
KETONES UR: NEGATIVE mg/dL
LEUKOCYTES UA: NEGATIVE
Nitrite: NEGATIVE
PROTEIN: NEGATIVE mg/dL
Specific Gravity, Urine: 1.019 (ref 1.005–1.030)
UROBILINOGEN UA: 1 mg/dL (ref 0.0–1.0)
pH: 6 (ref 5.0–8.0)

## 2014-07-28 LAB — LIPASE, BLOOD: LIPASE: 24 U/L (ref 11–59)

## 2014-07-28 MED ORDER — POLYETHYLENE GLYCOL 3350 17 GM/SCOOP PO POWD: ORAL | Status: DC

## 2014-07-28 MED ORDER — DICYCLOMINE HCL 20 MG PO TABS: 20.0000 mg | ORAL_TABLET | Freq: Two times a day (BID) | ORAL | Status: DC

## 2014-07-28 MED ORDER — ONDANSETRON HCL 4 MG PO TABS: 4.0000 mg | ORAL_TABLET | Freq: Three times a day (TID) | ORAL | Status: DC | PRN

## 2014-07-28 NOTE — ED Provider Notes (Signed)
CSN: [id number]     Arrival date & time 07/28/14  1242 History   First MD Initiated Contact with Patient 07/28/14 1624     Chief Complaint  Patient presents with  . Abdominal Pain     (Consider location/radiation/quality/duration/timing/severity/associated sxs/prior Treatment) HPI   This is a 58 year old female who presents emergency Department with chief complaint of abdominal discomfort and chronic constipation. The patient states that she has had greater than one month of chronic constipation. Prior to 3 weeks ago. She was increasing the amount of fruits and vegetables. She was eating, drinking lots of water without successful bowel movements. Patient states that she went to 3 weeks without a bowel movement. 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 Patient states she had one moderate sized bowel movement, but had excruciating abdominal cramps. The patient states that she was taking 1 capful of MiraLAX daily with 12 ounces of fluid and Colace and did not have another bowel movement for 7 days. On Thursday, she repeated her Dulcolax and had a small amount of bowel movement, yesterday. She complains of significant abdominal discomfort in the bilateral upper quadrants. She has chronic nausea, which is worse after eating. She feels distended and bloated. Patient states that she was trying to wait until Monday. She feels she needs a GI consult. She states that she was so uncomfortable she sought help today at the emergency department. She denies vomiting, hematochezia or melena. The patient denies chest pain, shortness ofrbreath.   Past Medical History  Diagnosis Date  . Anemia     takes iron supplement  . Dental crowns present   . Lipoma of neck 08/2012    right  . Fibroid     uterus   Past Surgical History  Procedure Laterality Date  . Cholecystectomy    . Lipoma excision  08/26/2012    Procedure: EXCISION LIPOMA;  Surgeon:  Rozetta Nunnery, MD;  Location: [place];  Service: ENT;  Laterality: Right;  . Tubal ligation  1990   Family History  Problem Relation Age of Onset  . Anesthesia problems Sister     hard to wake up post-op  . Breast cancer Mother   . Diabetes Paternal Grandmother   . Diabetes Maternal Grandmother    History  Substance Use Topics  . Smoking status: Never Smoker   . Smokeless tobacco: Never Used  . Alcohol Use: No   OB History    Gravida Para Term Preterm AB TAB SAB Ectopic Multiple Living   4 3 3  1     3      Review of Systems  Ten systems reviewed and are negative for acute change, except as noted in the HPI.     Allergies  Review of patient's allergies indicates no known allergies.  Home Medications   Prior to Admission medications   Medication Sig Start Date End Date Taking? Authorizing Provider  ferrous sulfate 325 (65 FE) MG tablet Take 325 mg by mouth daily with breakfast.    Historical Provider, MD  Multiple Vitamin (MULTIVITAMIN) tablet Take 1 tablet by mouth daily.    Historical Provider, MD   BP 143/63 mmHg  Pulse 71  Temp(Src) 98 F (36.7 C) (Oral)  Resp 18  SpO2 100%  LMP 08/03/2005 Physical Exam Physical Exam  Nursing note and vitals reviewed. Constitutional: She is oriented to person, place, and time. She appears well-developed and well-nourished. No distress.  HENT:  Head: Normocephalic and atraumatic.  Eyes: Conjunctivae normal and EOM are normal. Pupils are equal, round, and reactive to light. No scleral icterus.  Neck: Normal range of motion.  Cardiovascular: Normal rate, regular rhythm and normal heart sounds.  Exam reveals no gallop and no friction rub.   No murmur heard. Pulmonary/Chest: Effort normal and breath sounds normal. No respiratory distress.  Abdominal: Soft. Bowel sounds are normal. Diffuse abdominal tenderness.  Neurological: She is alert and oriented to person, place, and time.  Skin: Skin is warm and  dry. She is not diaphoretic.  GU: Digital Rectal Exam reveals sphincter with good tone.. No masses or fissures. Soft stool palpable in the rectal vault. Stool color is brown with no overt blood.   ED Course  Procedures (including critical care time) Labs Review Labs Reviewed  CBC WITH DIFFERENTIAL - Abnormal; Notable for the following:    Neutrophils Relative % 40 (*)    Lymphocytes Relative 50 (*)    All other components within normal limits  COMPREHENSIVE METABOLIC PANEL - Abnormal; Notable for the following:    Glucose, Bld 104 (*)    GFR calc non Af Amer 70 (*)    GFR calc Af Amer 81 (*)    All other components within normal limits  LIPASE, BLOOD  URINALYSIS, ROUTINE W REFLEX MICROSCOPIC    Imaging Review No results found.   EKG Interpretation None      MDM   Final diagnoses:  None    4:38 PM BP 143/63 mmHg  Pulse 71  Temp(Src) 98 F (36.7 C) (Oral)  Resp 18  SpO2 100%  LMP 08/03/2005  Patient lab work shows normal white blood cell count with significant lymphocytosis. Blood glucose is slightly elevated at 104. No other chemistry abnormalities. Patient's lipase is normal. Awaiting acute abdominal series.   8:33 PM BP 148/92 mmHg  Pulse 68  Temp(Src) 98 F (36.7 C) (Oral)  Resp 16  SpO2 100%  LMP 08/03/2005 Patients labs as stated above. Her nonradiating crashes a large stool burden. Patient will be discharged with Bentyl, Zofran and MiraLAX. Have given the patient GI follow-up for her chronic constipation. She was follow up with her primary care physician. She appears safe for discharge at this time.  Patient is nontoxic, nonseptic appearing, in no apparent distress.  Patient's pain and other symptoms adequately managed in emergency department. Labs, imaging and vitals reviewed.  Patient does not meet the SIRS or Sepsis criteria.  On repeat exam patient does not have a surgical abdomin and there are no peritoneal signs.  No indication of appendicitis,  bowel obstruction, bowel perforation, cholecystitis, diverticulitis, PID or ectopic pregnancy.  Patient discharged home with symptomatic treatment and given strict instructions for follow-up with their primary care physician.  I have also discussed reasons to return immediately to the ER.  Patient expresses understanding and agrees with plan.      Margarita Mail, PA-C 07/28/14 2034  Margarita Mail, PA-C 07/28/14 2034  Threasa Beards, MD 07/28/14 [phone]

## 2014-07-28 NOTE — ED Notes (Signed)
Pt reports having generalized abd pain x 2 weeks, having recent constipation. Pt has been taking several otc meds with minimal relief. Did have bowel movement yesterday but no relief from pain and pt now having nausea.

## 2014-10-25 ENCOUNTER — Other Ambulatory Visit: Payer: Self-pay

## 2014-10-25 DIAGNOSIS — Z1231 Encounter for screening mammogram for malignant neoplasm of breast: Secondary | ICD-10-CM

## 2014-11-01 ENCOUNTER — Ambulatory Visit
Admission: RE | Admit: 2014-11-01 | Discharge: 2014-11-01 | Disposition: A | Discharge status: Home / Self Care | Payer: 59 | Source: Ambulatory Visit

## 2014-11-01 DIAGNOSIS — Z1231 Encounter for screening mammogram for malignant neoplasm of breast: Secondary | ICD-10-CM

## 2015-06-12 ENCOUNTER — Encounter: Payer: Self-pay | Admitting: Obstetrics and Gynecology

## 2015-06-12 ENCOUNTER — Ambulatory Visit (INDEPENDENT_AMBULATORY_CARE_PROVIDER_SITE_OTHER): Payer: 59 | Admitting: Obstetrics and Gynecology

## 2015-06-12 VITALS — BP 140/80 | HR 76 | Resp 14 | Ht 65.5 in | Wt 224.0 lb

## 2015-06-12 DIAGNOSIS — K5900 Constipation, unspecified: Secondary | ICD-10-CM | POA: Diagnosis not present

## 2015-06-12 DIAGNOSIS — Z0000 Encounter for general adult medical examination without abnormal findings: Secondary | ICD-10-CM

## 2015-06-12 DIAGNOSIS — Z01419 Encounter for gynecological examination (general) (routine) without abnormal findings: Secondary | ICD-10-CM | POA: Diagnosis not present

## 2015-06-12 DIAGNOSIS — R5383 Other fatigue: Secondary | ICD-10-CM | POA: Diagnosis not present

## 2015-06-12 LAB — TSH: TSH: 1.236 u[IU]/mL (ref 0.350–4.500)

## 2015-06-12 NOTE — Progress Notes (Signed)
Patient ID: Donna Le, female   DOB: [birth year], 59 y.o.   MRN: [id number] 59 y.o. [id number] MarriedAfrican AmericanF here for annual exam.  Tolerable vasomotor symptoms. No vaginal bleeding. Sexually active, some issues with ED, no pain. She has some issues with constipation. Typically has a BM q 3-4 days, she is taking colace, some miralax, probiotics have helped some. Lately she has used a tea that helps. Last colonoscopy at 72. She does c/o fatigue and weight gain. H/O fibroid uterus, recorded as 12 week sized the last few years.     Patient's last menstrual period was 08/03/2005.          Sexually active: Yes.    The current method of family planning is status post hysterectomy and post menopausal status.    Exercising: No.  The patient does not participate in regular exercise at present. Smoker:  no  Health Maintenance: Pap: 04-24-13 WNL NEG HR HPV History of abnormal Pap:  no MMG:  11-01-14 WNL Colonoscopy:  2007 WNL BMD:   Never TDaP:  unsure Gardasil: N/A   reports that she has never smoked. She has never used smokeless tobacco. She reports that she does not drink alcohol or use illicit drugs.She is a Marine scientist, working for special needs patients at group homes, planning to retire.   Past Medical History  Diagnosis Date  . Anemia     takes iron supplement  . Dental crowns present   . Lipoma of neck 08/2012    right  . Fibroid     uterus  . Heart murmur     Past Surgical History  Procedure Laterality Date  . Cholecystectomy    . Lipoma excision  08/26/2012    Procedure: EXCISION LIPOMA;  Surgeon: Rozetta Nunnery, MD;  Location: [facility];  Service: ENT;  Laterality: Right;  . Tubal ligation  1990    Current Outpatient Prescriptions  Medication Sig Dispense Refill  . docusate sodium (COLACE) 100 MG capsule Take 100 mg by mouth 2 (two) times daily.    . ondansetron (ZOFRAN) 4 MG tablet Take 1 tablet (4 mg total) by mouth every 8 (eight) hours as  needed for nausea or vomiting. 10 tablet 0  . polyethylene glycol powder (GLYCOLAX/MIRALAX) powder Take 10 capfulls in 64 ounces of water before bed. 250 g 0  . Probiotic Product (PROBIOTIC DAILY PO) Take 1 capsule by mouth daily.    . simethicone (MYLICON) 80 MG chewable tablet Chew 80 mg by mouth every 6 (six) hours as needed for flatulence.     No current facility-administered medications for this visit.    Family History  Problem Relation Age of Onset  . Anesthesia problems Sister     hard to wake up post-op  . Breast cancer Mother   . Diabetes Paternal Grandmother   . Diabetes Maternal Grandmother   Mom with breast cancer at 47  Review of Systems  Constitutional: Negative.   HENT: Negative.   Eyes: Negative.   Respiratory: Negative.   Cardiovascular: Negative.   Gastrointestinal: Positive for constipation.  Endocrine: Negative.   Genitourinary: Negative.   Musculoskeletal: Negative.   Skin: Negative.   Allergic/Immunologic: Negative.   Neurological: Negative.   Psychiatric/Behavioral: Negative.     Exam:   BP 140/80 mmHg  Pulse 76  Resp 14  Ht 5' 5.5" (1.664 m)  Wt 224 lb (101.606 kg)  BMI 36.70 kg/m2  LMP 08/03/2005  Weight change: @WEIGHTCHANGE @ Height:   Height: 5' 5.5" (166.4  cm)  Ht Readings from Last 3 Encounters:  06/12/15 5' 5.5" (1.664 m)  05/07/14 5' 5.75" (1.67 m)  04/24/13 5' 5.5" (1.664 m)    General appearance: alert, cooperative and appears stated age Head: Normocephalic, without obvious abnormality, atraumatic Neck: no adenopathy, supple, symmetrical, trachea midline and thyroid normal to inspection and palpation Lungs: clear to auscultation bilaterally Breasts: normal appearance, no masses or tenderness Heart: regular rate and rhythm Abdomen: soft, non-tender; bowel sounds normal; no masses,  no organomegaly Extremities: extremities normal, atraumatic, no cyanosis or edema Skin: Skin color, texture, turgor normal. No rashes or  lesions Lymph nodes: Cervical, supraclavicular, and axillary nodes normal. No abnormal inguinal nodes palpated Neurologic: Grossly normal   Pelvic: External genitalia:  no lesions              Urethra:  normal appearing urethra with no masses, tenderness or lesions              Bartholins and Skenes: normal                 Vagina: normal appearing vagina with normal color and discharge, no lesions              Cervix: no lesions               Bimanual Exam:  Uterus: anteverted, mobile, slightly irregular, 8 week sized (smaller)              Adnexa: no mass, fullness, tenderness               Rectovaginal: Confirms               Anus:  normal sphincter tone, no lesions  Chaperone was present for exam.  A:  Well Woman with normal exam  Fatigue, constipation  Fibroid uterus, feels smaller on exam  P:   No pap this year  TSH, vit D  Other labs with primary  Mammogram in the spring  Continue breast self exams  Discussed calcium and vit D  Colonoscopy due she will schedule

## 2015-06-12 NOTE — Patient Instructions (Signed)
EXERCISE AND DIET:  We recommended that you start or continue a regular exercise program for good health. Regular exercise means any activity that makes your heart beat faster and makes you sweat.  We recommend exercising at least 30 minutes per day at least 3 days a week, preferably 4 or 5.  We also recommend a diet low in fat and sugar.  Inactivity, poor dietary choices and obesity can cause diabetes, heart attack, stroke, and kidney damage, among others.    ALCOHOL AND SMOKING:  Women should limit their alcohol intake to no more than 7 drinks/beers/glasses of wine (combined, not each!) per week. Moderation of alcohol intake to this level decreases your risk of breast cancer and liver damage. And of course, no recreational drugs are part of a healthy lifestyle.  And absolutely no smoking or even second hand smoke. Most people know smoking can cause heart and lung diseases, but did you know it also contributes to weakening of your bones? Aging of your skin?  Yellowing of your teeth and nails?  CALCIUM AND VITAMIN D:  Adequate intake of calcium and Vitamin D are recommended.  The recommendations for exact amounts of these supplements seem to change often, but generally speaking 600 mg of calcium (either carbonate or citrate) and 800 units of Vitamin D per day seems prudent. Certain women may benefit from higher intake of Vitamin D.  If you are among these women, your doctor will have told you during your visit.    PAP SMEARS:  Pap smears, to check for cervical cancer or precancers,  have traditionally been done yearly, although recent scientific advances have shown that most women can have pap smears less often.  However, every woman still should have a physical exam from her gynecologist every year. It will include a breast check, inspection of the vulva and vagina to check for abnormal growths or skin changes, a visual exam of the cervix, and then an exam to evaluate the size and shape of the uterus and  ovaries.  And after 60 years of age, a rectal exam is indicated to check for rectal cancers. We will also provide age appropriate advice regarding health maintenance, like when you should have certain vaccines, screening for sexually transmitted diseases, bone density testing, colonoscopy, mammograms, etc.   MAMMOGRAMS:  All women over 40 years old should have a yearly mammogram. Many facilities now offer a "3D" mammogram, which may cost around $50 extra out of pocket. If possible,  we recommend you accept the option to have the 3D mammogram performed.  It both reduces the number of women who will be called back for extra views which then turn out to be normal, and it is better than the routine mammogram at detecting truly abnormal areas.    COLONOSCOPY:  Colonoscopy to screen for colon cancer is recommended for all women at age 50.  We know, you hate the idea of the prep.  We agree, BUT, having colon cancer and not knowing it is worse!!  Colon cancer so often starts as a polyp that can be seen and removed at colonscopy, which can quite literally save your life!  And if your first colonoscopy is normal and you have no family history of colon cancer, most women don't have to have it again for 10 years.  Once every ten years, you can do something that may end up saving your life, right?  We will be happy to help you get it scheduled when you are ready.    Be sure to check your insurance coverage so you understand how much it will cost.  It may be covered as a preventative service at no cost, but you should check your particular policy.     About Constipation  Constipation Overview Constipation is the most common gastrointestinal complaint - about 4 million Americans experience constipation and make 2.5 million physician visits a year to get help for the problem.  Constipation can occur when the colon absorbs too much water, the colon's muscle contraction is slow or sluggish, and/or there is delayed transit  time through the colon.  The result is stool that is hard and dry.  Indicators of constipation include straining during bowel movements greater than 25% of the time, having fewer than three bowel movements per week, and/or the feeling of incomplete evacuation.  There are established guidelines ([place] II ) for defining constipation. A person needs to have two or more of the following symptoms for at least 12 weeks (not necessarily consecutive) in the preceding 12 months: . Straining in  greater than 25% of bowel movements . Lumpy or hard stools in greater than 25% of bowel movements . Sensation of incomplete emptying in greater than 25% of bowel movements . Sensation of anorectal obstruction/blockade in greater than 25% of bowel movements . Manual maneuvers to help empty greater than 25% of bowel movements (e.g., digital evacuation, support of the pelvic floor)  . Less than  3 bowel movements/week . Loose stools are not present, and criteria for irritable bowel syndrome are insufficient  Common Causes of Constipation . Lack of fiber in your diet . Lack of physical activity . Medications, including iron and calcium supplements  . Dairy intake . Dehydration . Abuse of laxatives  Travel  Irritable Bowel Syndrome  Pregnancy  Luteal phase of menstruation (after ovulation and before menses)  Colorectal problems  Intestinal Dysfunction  Treating Constipation  There are several ways of treating constipation, including changes to diet and exercise, use of laxatives, adjustments to the pelvic floor, and scheduled toileting.  These treatments include: . increasing fiber and fluids in the diet  . increasing physical activity . learning muscle coordination   learning proper toileting techniques and toileting modifications   designing and sticking  to a toileting schedule     2007, [employer] Doc.22 

## 2015-06-13 LAB — VITAMIN D 25 HYDROXY (VIT D DEFICIENCY, FRACTURES): Vit D, 25-Hydroxy: 17 ng/mL — ABNORMAL LOW (ref 30–100)

## 2015-06-14 ENCOUNTER — Other Ambulatory Visit: Payer: Self-pay | Admitting: Obstetrics and Gynecology

## 2015-06-14 DIAGNOSIS — E559 Vitamin D deficiency, unspecified: Secondary | ICD-10-CM

## 2015-09-16 ENCOUNTER — Telehealth: Payer: Self-pay | Admitting: Obstetrics and Gynecology

## 2015-09-16 NOTE — Telephone Encounter (Signed)
Patient cancelled appointment for lab work and will call back to reschedule.

## 2015-09-17 ENCOUNTER — Other Ambulatory Visit: Payer: Self-pay

## 2016-02-05 ENCOUNTER — Other Ambulatory Visit: Payer: Self-pay | Admitting: Family Medicine

## 2016-02-05 DIAGNOSIS — Z1231 Encounter for screening mammogram for malignant neoplasm of breast: Secondary | ICD-10-CM

## 2016-02-07 ENCOUNTER — Ambulatory Visit
Admission: RE | Admit: 2016-02-07 | Discharge: 2016-02-07 | Disposition: A | Discharge status: Home / Self Care | Payer: 59 | Source: Ambulatory Visit | Attending: Family Medicine | Admitting: Family Medicine

## 2016-02-07 DIAGNOSIS — Z1231 Encounter for screening mammogram for malignant neoplasm of breast: Secondary | ICD-10-CM

## 2016-03-02 ENCOUNTER — Encounter: Payer: Self-pay | Admitting: Gastroenterology

## 2016-06-18 ENCOUNTER — Ambulatory Visit: Payer: 59 | Admitting: Obstetrics and Gynecology

## 2016-06-18 ENCOUNTER — Encounter: Payer: Self-pay | Admitting: Obstetrics and Gynecology

## 2016-06-18 VITALS — BP 130/80 | HR 72 | Resp 15 | Ht 65.5 in | Wt 223.0 lb

## 2016-06-18 DIAGNOSIS — Z124 Encounter for screening for malignant neoplasm of cervix: Secondary | ICD-10-CM | POA: Diagnosis not present

## 2016-06-18 DIAGNOSIS — N393 Stress incontinence (female) (male): Secondary | ICD-10-CM

## 2016-06-18 DIAGNOSIS — Z803 Family history of malignant neoplasm of breast: Secondary | ICD-10-CM

## 2016-06-18 DIAGNOSIS — Z01419 Encounter for gynecological examination (general) (routine) without abnormal findings: Secondary | ICD-10-CM | POA: Diagnosis not present

## 2016-06-18 NOTE — Progress Notes (Signed)
60 y.o. [id number] MarriedAfrican AmericanF here for annual exam.  Plans to retire in the next year. She has some alopecia in her scalp, negative biopsy for lupus. No vaginal bleeding. Sexually active, no pain.     Patient's last menstrual period was 08/03/2005.          Sexually active: Yes.    The current method of family planning is post menopausal status.    Exercising: No.  The patient does not participate in regular exercise at present. Smoker:  no  Health Maintenance: Pap:  04-24-13 WNL NEG HR HPV History of abnormal Pap:  no MMG:  02-07-16 WNL Colonoscopy:  05-05-06  BMD:   Never TDaP:  Unsure, UTD Gardasil: N/A   reports that she has never smoked. She has never used smokeless tobacco. She reports that she does not drink alcohol or use drugs. Nurse, works with special needs patients at group homes. 1 kid is local, 2 [place]. 1 grandson 8, local.   Past Medical History:  Diagnosis Date  . Anemia    takes iron supplement  . Dental crowns present   . Fibroid    uterus  . Heart murmur   . Lipoma of neck 08/2012   right    Past Surgical History:  Procedure Laterality Date  . CHOLECYSTECTOMY    . LIPOMA EXCISION  08/26/2012   Procedure: EXCISION LIPOMA;  Surgeon: Rozetta Nunnery, MD;  Location: [facility];  Service: ENT;  Laterality: Right;  . TUBAL LIGATION  1990    Current Outpatient Prescriptions  Medication Sig Dispense Refill  . Cholecalciferol (VITAMIN D3) 2000 units CHEW Chew by mouth.    . Probiotic Product (PROBIOTIC DAILY PO) Take 1 capsule by mouth daily.    . simethicone (MYLICON) 80 MG chewable tablet Chew 80 mg by mouth every 6 (six) hours as needed for flatulence.     No current facility-administered medications for this visit.     Family History  Problem Relation Age of Onset  . Anesthesia problems Sister     hard to wake up post-op  . Breast cancer Mother   . Diabetes Paternal Grandmother   . Diabetes Maternal 32   Mom  was 36 with breast cancer, died at 57.   Review of Systems  Constitutional: Negative.   HENT: Negative.   Eyes: Negative.   Respiratory: Negative.   Cardiovascular: Negative.   Gastrointestinal: Negative.   Endocrine: Negative.   Genitourinary: Negative.   Musculoskeletal: Negative.   Skin: Negative.   Allergic/Immunologic: Negative.   Neurological: Negative.   Psychiatric/Behavioral: Negative.   Some GSI with cough, small amounts, infrequent and tolerable.   Exam:   BP 130/80 (BP Location: Right Arm, Patient Position: Sitting, Cuff Size: Normal)   Pulse 72   Resp 15   Ht 5' 5.5" (1.664 m)   Wt 223 lb (101.2 kg)   LMP 08/03/2005   BMI 36.54 kg/m   Weight change: @WEIGHTCHANGE @ Height:   Height: 5' 5.5" (166.4 cm)  Ht Readings from Last 3 Encounters:  06/18/16 5' 5.5" (1.664 m)  06/12/15 5' 5.5" (1.664 m)  05/07/14 5' 5.75" (1.67 m)    General appearance: alert, cooperative and appears stated age Head: Normocephalic, without obvious abnormality, atraumatic Neck: no adenopathy, supple, symmetrical, trachea midline and thyroid normal to inspection and palpation Lungs: clear to auscultation bilaterally Breasts: normal appearance, no masses or tenderness Heart: regular rate and rhythm Abdomen: soft, non-tender; bowel sounds normal; no masses,  no organomegaly Extremities:  extremities normal, atraumatic, no cyanosis or edema Skin: Skin color, texture, turgor normal. No rashes or lesions Lymph nodes: Cervical, supraclavicular, and axillary nodes normal. No abnormal inguinal nodes palpated Neurologic: Grossly normal   Pelvic: External genitalia:  no lesions              Urethra:  normal appearing urethra with no masses, tenderness or lesions              Bartholins and Skenes: normal                 Vagina: normal appearing vagina with normal color and discharge, no lesions              Cervix: no lesions               Bimanual Exam:  Uterus:  anteverted, mobile, non  tender, top normal sized              Adnexa: no mass, fullness, tenderness                Rectovaginal: Confirms               Anus:  normal sphincter tone, no lesions  Chaperone was present for exam.  A:  Well Woman with normal exam  FH breast cancer, mother died at 12. She declines consult with a geneticist.   Mild GSI, no change.   H/O fibroid uterus, top normal uterus on exam, stable  P:   Pap with hpv  Mammogram utd, recommended she get 3D mammograms  Discussed breast self exam  Discussed calcium and vit D intake  Labs and immunizations with primary MD  Kegel information given

## 2016-06-18 NOTE — Patient Instructions (Signed)
EXERCISE AND DIET:  We recommended that you start or continue a regular exercise program for good health. Regular exercise means any activity that makes your heart beat faster and makes you sweat.  We recommend exercising at least 30 minutes per day at least 3 days a week, preferably 4 or 5.  We also recommend a diet low in fat and sugar.  Inactivity, poor dietary choices and obesity can cause diabetes, heart attack, stroke, and kidney damage, among others.    ALCOHOL AND SMOKING:  Women should limit their alcohol intake to no more than 7 drinks/beers/glasses of wine (combined, not each!) per week. Moderation of alcohol intake to this level decreases your risk of breast cancer and liver damage. And of course, no recreational drugs are part of a healthy lifestyle.  And absolutely no smoking or even second hand smoke. Most people know smoking can cause heart and lung diseases, but did you know it also contributes to weakening of your bones? Aging of your skin?  Yellowing of your teeth and nails?  CALCIUM AND VITAMIN D:  Adequate intake of calcium and Vitamin D are recommended.  The recommendations for exact amounts of these supplements seem to change often, but generally speaking 600 mg of calcium (either carbonate or citrate) and 800 units of Vitamin D per day seems prudent. Certain women may benefit from higher intake of Vitamin D.  If you are among these women, your doctor will have told you during your visit.    PAP SMEARS:  Pap smears, to check for cervical cancer or precancers,  have traditionally been done yearly, although recent scientific advances have shown that most women can have pap smears less often.  However, every woman still should have a physical exam from her gynecologist every year. It will include a breast check, inspection of the vulva and vagina to check for abnormal growths or skin changes, a visual exam of the cervix, and then an exam to evaluate the size and shape of the uterus and  ovaries.  And after 61 years of age, a rectal exam is indicated to check for rectal cancers. We will also provide age appropriate advice regarding health maintenance, like when you should have certain vaccines, screening for sexually transmitted diseases, bone density testing, colonoscopy, mammograms, etc.   MAMMOGRAMS:  All women over 40 years old should have a yearly mammogram. Many facilities now offer a "3D" mammogram, which may cost around $50 extra out of pocket. If possible,  we recommend you accept the option to have the 3D mammogram performed.  It both reduces the number of women who will be called back for extra views which then turn out to be normal, and it is better than the routine mammogram at detecting truly abnormal areas.    COLONOSCOPY:  Colonoscopy to screen for colon cancer is recommended for all women at age 50.  We know, you hate the idea of the prep.  We agree, BUT, having colon cancer and not knowing it is worse!!  Colon cancer so often starts as a polyp that can be seen and removed at colonscopy, which can quite literally save your life!  And if your first colonoscopy is normal and you have no family history of colon cancer, most women don't have to have it again for 10 years.  Once every ten years, you can do something that may end up saving your life, right?  We will be happy to help you get it scheduled when you are ready.    Be sure to check your insurance coverage so you understand how much it will cost.  It may be covered as a preventative service at no cost, but you should check your particular policy.     Kegel Exercises The goal of Kegel exercises is to isolate and exercise your pelvic floor muscles. These muscles act as a hammock that supports the rectum, vagina, small intestine, and uterus. As the muscles weaken, the hammock sags and these organs are displaced from their normal positions. Kegel exercises can strengthen your pelvic floor muscles and help you to improve  bladder and bowel control, improve sexual response, and help reduce many problems and some discomfort during pregnancy. Kegel exercises can be done anywhere and at any time. HOW TO PERFORM KEGEL EXERCISES 1. Locate your pelvic floor muscles. To do this, squeeze (contract) the muscles that you use when you try to stop the flow of urine. You will feel a tightness in the vaginal area (women) and a tight lift in the rectal area (men and women). 2. When you begin, contract your pelvic muscles tight for 2-5 seconds, then relax them for 2-5 seconds. This is one set. Do 4-5 sets with a short pause in between. 3. Contract your pelvic muscles for 8-10 seconds, then relax them for 8-10 seconds. Do 4-5 sets. If you cannot contract your pelvic muscles for 8-10 seconds, try 5-7 seconds and work your way up to 8-10 seconds. Your goal is 4-5 sets of 10 contractions each day. Keep your stomach, buttocks, and legs relaxed during the exercises. Perform sets of both short and long contractions. Vary your positions. Perform these contractions 3-4 times per day. Perform sets while you are:   Lying in bed in the morning.  Standing at lunch.  Sitting in the late afternoon.  Lying in bed at night. You should do 40-50 contractions per day. Do not perform more Kegel exercises per day than recommended. Overexercising can cause muscle fatigue. Continue these exercises for for at least 15-20 weeks or as directed by your caregiver. This information is not intended to replace advice given to you by your health care provider. Make sure you discuss any questions you have with your health care provider. Document Released: 07/06/2012 Document Revised: 08/10/2014 Document Reviewed: 06/09/2015 Elsevier Interactive Patient Education  2017 [employer]  

## 2016-06-22 LAB — IPS PAP TEST WITH HPV

## 2016-12-24 DIAGNOSIS — E7800 Pure hypercholesterolemia, unspecified: Secondary | ICD-10-CM | POA: Diagnosis not present

## 2016-12-24 DIAGNOSIS — R7303 Prediabetes: Secondary | ICD-10-CM | POA: Diagnosis not present

## 2016-12-24 DIAGNOSIS — E559 Vitamin D deficiency, unspecified: Secondary | ICD-10-CM | POA: Diagnosis not present

## 2017-04-09 ENCOUNTER — Ambulatory Visit (AMBULATORY_SURGERY_CENTER): Payer: Self-pay | Admitting: *Deleted

## 2017-04-09 VITALS — Ht 65.5 in | Wt 219.2 lb

## 2017-04-09 DIAGNOSIS — Z1211 Encounter for screening for malignant neoplasm of colon: Secondary | ICD-10-CM

## 2017-04-09 MED ORDER — NA SULFATE-K SULFATE-MG SULF 17.5-3.13-1.6 GM/177ML PO SOLN: 1.0000 [IU] | Freq: Once | ORAL | 0 refills | Status: AC

## 2017-04-09 NOTE — Progress Notes (Signed)
No egg or soy allergy known to patient  No issues with past sedation with any surgeries  or procedures, no intubation problems  No diet pills per patient No home 02 use per patient  No blood thinners per patient  Pt denies issues with constipation so on probiotics and is better  No A fib or A flutter  EMMI video sent to pt's e mail pt. declined

## 2017-04-14 ENCOUNTER — Encounter: Payer: 59 | Admitting: Gastroenterology

## 2017-04-14 ENCOUNTER — Encounter: Payer: Self-pay | Admitting: Gastroenterology

## 2017-04-28 ENCOUNTER — Ambulatory Visit (AMBULATORY_SURGERY_CENTER): Payer: 59 | Admitting: Gastroenterology

## 2017-04-28 ENCOUNTER — Encounter: Payer: Self-pay | Admitting: Gastroenterology

## 2017-04-28 VITALS — BP 143/88 | HR 63 | Temp 97.1°F | Resp 10 | Ht 65.5 in | Wt 219.0 lb

## 2017-04-28 DIAGNOSIS — Z86010 Personal history of colonic polyps: Secondary | ICD-10-CM | POA: Diagnosis not present

## 2017-04-28 DIAGNOSIS — D125 Benign neoplasm of sigmoid colon: Secondary | ICD-10-CM

## 2017-04-28 DIAGNOSIS — D123 Benign neoplasm of transverse colon: Secondary | ICD-10-CM

## 2017-04-28 DIAGNOSIS — Z1211 Encounter for screening for malignant neoplasm of colon: Secondary | ICD-10-CM

## 2017-04-28 MED ORDER — SODIUM CHLORIDE 0.9 % IV SOLN: 500.0000 mL | INTRAVENOUS | Status: DC

## 2017-04-28 NOTE — Patient Instructions (Signed)
YOU HAD AN ENDOSCOPIC PROCEDURE TODAY AT THE [facility]:   Refer to the procedure report that was given to you for any specific questions about what was found during the examination.  If the procedure report does not answer your questions, please call your gastroenterologist to clarify.  If you requested that your care partner not be given the details of your procedure findings, then the procedure report has been included in a sealed envelope for you to review at your convenience later.  YOU SHOULD EXPECT: Some feelings of bloating in the abdomen. Passage of more gas than usual.  Walking can help get rid of the air that was put into your GI tract during the procedure and reduce the bloating. If you had a lower endoscopy (such as a colonoscopy or flexible sigmoidoscopy) you may notice spotting of blood in your stool or on the toilet paper. If you underwent a bowel prep for your procedure, you may not have a normal bowel movement for a few days.  Please Note:  You might notice some irritation and congestion in your nose or some drainage.  This is from the oxygen used during your procedure.  There is no need for concern and it should clear up in a day or so.  SYMPTOMS TO REPORT IMMEDIATELY:   Following lower endoscopy (colonoscopy or flexible sigmoidoscopy):  Excessive amounts of blood in the stool  Significant tenderness or worsening of abdominal pains  Swelling of the abdomen that is new, acute  Fever of 100F or higher    For urgent or emergent issues, a gastroenterologist can be reached at any hour by calling [phone].   DIET:  We do recommend a small meal at first, but then you may proceed to your regular diet.  Drink plenty of fluids but you should avoid alcoholic beverages for 24 hours.  ACTIVITY:  You should plan to take it easy for the rest of today and you should NOT DRIVE or use heavy machinery until tomorrow (because of the sedation medicines used during the test).     FOLLOW UP: Our staff will call the number listed on your records the next business day following your procedure to check on you and address any questions or concerns that you may have regarding the information given to you following your procedure. If we do not reach you, we will leave a message.  However, if you are feeling well and you are not experiencing any problems, there is no need to return our call.  We will assume that you have returned to your regular daily activities without incident.  If any biopsies were taken you will be contacted by phone or by letter within the next 1-3 weeks.  Please call us at [phone] if you have not heard about the biopsies in 3 weeks.    SIGNATURES/CONFIDENTIALITY: You and/or your care partner have signed paperwork which will be entered into your electronic medical record.  These signatures attest to the fact that that the information above on your After Visit Summary has been reviewed and is understood.  Full responsibility of the confidentiality of this discharge information lies with you and/or your care-partner.   Resume medications. Information given on polyps. 

## 2017-04-28 NOTE — Progress Notes (Signed)
Called to room to assist during endoscopic procedure.  Patient ID and intended procedure confirmed with present staff. Received instructions for my participation in the procedure from the performing physician.  

## 2017-04-28 NOTE — Progress Notes (Signed)
Report given to PACU, vss 

## 2017-04-28 NOTE — Op Note (Signed)
[facility] Patient Name: Donna Le Procedure Date: 04/28/2017 10:39 AM MRN: [id number] Endoscopist: Milus Banister , MD Age: 61 Referring MD:  Date of Birth: 01/21/1956 Gender: Female Account #: [id number] Procedure:                Colonoscopy Indications:              Screening for colorectal malignant neoplasm Medicines:                Monitored Anesthesia Care Procedure:                Pre-Anesthesia Assessment:                           - Prior to the procedure, a History and Physical                            was performed, and patient medications and                            allergies were reviewed. The patient's tolerance of                            previous anesthesia was also reviewed. The risks                            and benefits of the procedure and the sedation                            options and risks were discussed with the patient.                            All questions were answered, and informed consent                            was obtained. Prior Anticoagulants: The patient has                            taken no previous anticoagulant or antiplatelet                            agents. ASA Grade Assessment: II - A patient with                            mild systemic disease. After reviewing the risks                            and benefits, the patient was deemed in                            satisfactory condition to undergo the procedure.                           After obtaining informed consent, the colonoscope  was passed under direct vision. Throughout the                            procedure, the patient's blood pressure, pulse, and                            oxygen saturations were monitored continuously. The                            Colonoscope was introduced through the anus and                            advanced to the the cecum, identified by                            appendiceal orifice and  ileocecal valve. The                            colonoscopy was performed without difficulty. The                            patient tolerated the procedure well. The quality                            of the bowel preparation was good. The ileocecal                            valve, appendiceal orifice, and rectum were                            photographed. Scope In: 10:47:01 AM Scope Out: 11:05:25 AM Scope Withdrawal Time: 0 hours 12 minutes 11 seconds  Total Procedure Duration: 0 hours 18 minutes 24 seconds  Findings:                 Three sessile polyps were found in the sigmoid                            colon and hepatic flexure. The polyps were 2 to 5                            mm in size. These polyps were removed with a cold                            snare. Resection was complete, but the polyp tissue                            was only partially retrieved.                           The exam was otherwise without abnormality on                            direct and retroflexion views. Complications:  No immediate complications. Estimated blood loss:                            None. Estimated Blood Loss:     Estimated blood loss: none. Impression:               - Three 2 to 5 mm polyps in the sigmoid colon and                            at the hepatic flexure, removed with a cold snare.                            Complete resection. Partial retrieval.                           - The examination was otherwise normal on direct                            and retroflexion views. Recommendation:           - Patient has a contact number available for                            emergencies. The signs and symptoms of potential                            delayed complications were discussed with the                            patient. Return to normal activities tomorrow.                            Written discharge instructions were provided to the                             patient.                           - Resume previous diet.                           - Continue present medications.                           You will receive a letter within 2-3 weeks with the                            pathology results and my final recommendations.                           If the polyp(s) is proven to be 'pre-cancerous' on                            pathology, you will need repeat colonoscopy in 5  years. If the polyp(s) is NOT 'precancerous' on                            pathology then you should repeat colon cancer                            screening in 10 years with colonoscopy without need                            for colon cancer screening by any method prior to                            then (including stool testing). Milus Banister, MD 04/28/2017 11:07:33 AM This report has been signed electronically.

## 2017-04-28 NOTE — Progress Notes (Signed)
Pt's states no medical or surgical changes since previsit or office visit. 

## 2017-04-29 ENCOUNTER — Telehealth: Payer: Self-pay | Admitting: *Deleted

## 2017-04-29 NOTE — Telephone Encounter (Signed)
No answer for post procedure call back. Will attempt to call back later this afternoon. Sm 

## 2017-04-29 NOTE — Telephone Encounter (Signed)
  Follow up Call-  Call back number 04/28/2017  Post procedure Call Back phone  # [phone]  Permission to leave phone message Yes  Some recent data might be hidden     Patient questions:  Do you have a fever, pain , or abdominal swelling? No. Pain Score  0 *  Have you tolerated food without any problems? Yes.    Have you been able to return to your normal activities? Yes.    Do you have any questions about your discharge instructions: Diet   No. Medications  No. Follow up visit  No.  Do you have questions or concerns about your Care? No.  Actions: * If pain score is 4 or above: No action needed, pain <4.

## 2017-05-04 ENCOUNTER — Encounter: Payer: Self-pay | Admitting: Gastroenterology

## 2017-05-05 ENCOUNTER — Other Ambulatory Visit: Payer: Self-pay | Admitting: Family Medicine

## 2017-05-05 DIAGNOSIS — Z1231 Encounter for screening mammogram for malignant neoplasm of breast: Secondary | ICD-10-CM

## 2017-05-06 ENCOUNTER — Ambulatory Visit
Admission: RE | Admit: 2017-05-06 | Discharge: 2017-05-06 | Disposition: A | Discharge status: Home / Self Care | Payer: 59 | Source: Ambulatory Visit | Attending: Family Medicine | Admitting: Family Medicine

## 2017-05-06 DIAGNOSIS — Z1231 Encounter for screening mammogram for malignant neoplasm of breast: Secondary | ICD-10-CM

## 2017-05-10 DIAGNOSIS — H35373 Puckering of macula, bilateral: Secondary | ICD-10-CM | POA: Diagnosis not present

## 2017-05-10 DIAGNOSIS — H40013 Open angle with borderline findings, low risk, bilateral: Secondary | ICD-10-CM | POA: Diagnosis not present

## 2017-05-10 DIAGNOSIS — H2513 Age-related nuclear cataract, bilateral: Secondary | ICD-10-CM | POA: Diagnosis not present

## 2017-06-01 DIAGNOSIS — Z23 Encounter for immunization: Secondary | ICD-10-CM | POA: Diagnosis not present

## 2017-06-21 DIAGNOSIS — R7303 Prediabetes: Secondary | ICD-10-CM | POA: Diagnosis not present

## 2017-06-21 DIAGNOSIS — D509 Iron deficiency anemia, unspecified: Secondary | ICD-10-CM | POA: Diagnosis not present

## 2017-06-21 DIAGNOSIS — E559 Vitamin D deficiency, unspecified: Secondary | ICD-10-CM | POA: Diagnosis not present

## 2017-06-21 DIAGNOSIS — E7800 Pure hypercholesterolemia, unspecified: Secondary | ICD-10-CM | POA: Diagnosis not present

## 2017-06-28 ENCOUNTER — Ambulatory Visit: Payer: 59 | Admitting: Obstetrics and Gynecology

## 2017-06-28 ENCOUNTER — Other Ambulatory Visit: Payer: Self-pay

## 2017-06-28 ENCOUNTER — Encounter: Payer: Self-pay | Admitting: Obstetrics and Gynecology

## 2017-06-28 VITALS — BP 122/72 | HR 72 | Resp 16 | Ht 65.5 in | Wt 217.0 lb

## 2017-06-28 DIAGNOSIS — Z01419 Encounter for gynecological examination (general) (routine) without abnormal findings: Secondary | ICD-10-CM

## 2017-06-28 DIAGNOSIS — N852 Hypertrophy of uterus: Secondary | ICD-10-CM

## 2017-06-28 NOTE — Progress Notes (Signed)
61 y.o. [id number] MarriedAfrican AmericanF here for annual exam.  No vaginal bleeding. Not sexually active secondary to ED.  She was diagnosed with pre-diabetes and has changed her diet and is exercising.     Patient's last menstrual period was 08/03/2005.          Sexually active: Yes.    The current method of family planning is post menopausal status.    Exercising: Yes.    GYM/ Cardio  Smoker:  no  Health Maintenance: Pap:  06-18-16 WNL NEG HR HPV 04-24-13 WNL NEG HR HPV History of abnormal Pap: Yes years ago MMG:  05-06-17 WNL  Colonoscopy:  04-28-17 polyps  BMD:   Never TDaP:  Up to date with PCP  Gardasil: N/A   reports that  has never smoked. she has never used smokeless tobacco. She reports that she does not drink alcohol or use drugs. 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 1 kid is local, 2 [place]. 1 grandson (7) , local. 3 sons, one is in the hospital, morbidly obese, heart disease, HTN (only 36).   Past Medical History:  Diagnosis Date  . Allergy   . Anemia   . Dental crowns present   . Fibroid    uterus  . Heart murmur   . Lipoma of neck 08/2012   right    Past Surgical History:  Procedure Laterality Date  . CHOLECYSTECTOMY    . COLONOSCOPY    . LIPOMA EXCISION  08/26/2012   Procedure: EXCISION LIPOMA;  Surgeon: Rozetta Nunnery, MD;  Location: [facility];  Service: ENT;  Laterality: Right;  . TUBAL LIGATION  1990    Current Outpatient Medications  Medication Sig Dispense Refill  . Cholecalciferol (VITAMIN D3) 2000 units CHEW Chew by mouth.    . Probiotic Product (PROBIOTIC DAILY PO) Take 1 capsule by mouth daily.     Current Facility-Administered Medications  Medication Dose Route Frequency Provider Last Rate Last Dose  . 0.9 %  sodium chloride infusion  500 mL Intravenous Continuous Milus Banister, MD        Family History  Problem Relation Age of Onset  . Anesthesia  problems Sister        hard to wake up post-op  . Breast cancer Mother   . Diabetes Paternal Grandmother   . Diabetes Maternal Grandmother   . Colon cancer Neg Hx   . Colon polyps Neg Hx   . Esophageal cancer Neg Hx   . Rectal cancer Neg Hx   . Stomach cancer Neg Hx     Review of Systems  Constitutional: Negative.   HENT: Negative.   Eyes: Negative.   Respiratory: Negative.   Cardiovascular: Negative.   Gastrointestinal: Negative.   Endocrine: Negative.   Genitourinary: Negative.   Musculoskeletal: Negative.   Skin: Negative.   Allergic/Immunologic: Negative.   Neurological: Negative.   Psychiatric/Behavioral: Negative.     Exam:   BP 122/72 (BP Location: Right Arm, Patient Position: Sitting, Cuff Size: Normal)   Pulse 72   Resp 16   Ht 5' 5.5" (1.664 m)   Wt 217 lb (98.4 kg)   LMP 08/03/2005   BMI 35.56 kg/m   Weight change: @WEIGHTCHANGE @ Height:   Height: 5' 5.5" (166.4 cm)  Ht Readings from Last 3 Encounters:  06/28/17 5' 5.5" (1.664 m)  04/28/17 5' 5.5" (1.664 m)  04/09/17 5' 5.5" (1.664 m)    General  appearance: alert, cooperative and appears stated age Head: Normocephalic, without obvious abnormality, atraumatic Neck: no adenopathy, supple, symmetrical, trachea midline and thyroid normal to inspection and palpation Lungs: clear to auscultation bilaterally Cardiovascular: regular rate and rhythm Breasts: normal appearance, no masses or tenderness Abdomen: soft, non-tender; non distended,  no masses,  no organomegaly Extremities: extremities normal, atraumatic, no cyanosis or edema Skin: Skin color, texture, turgor normal. No rashes or lesions Lymph nodes: Cervical, supraclavicular, and axillary nodes normal. No abnormal inguinal nodes palpated Neurologic: Grossly normal   Pelvic: External genitalia:  no lesions              Urethra:  normal appearing urethra with no masses, tenderness or lesions              Bartholins and Skenes: normal                  Vagina: normal appearing vagina with normal color and discharge, no lesions              Cervix: no lesions               Bimanual Exam:  Uterus:  anteverted mobile, irregular,enlarged 8-10 week sized              Adnexa: no mass, fullness, tenderness               Rectovaginal: Confirms               Anus:  normal sphincter tone, no lesions  Chaperone was present for exam.  A:  Well Woman with normal exam  Uterus feels enlarged  P:   Labs with primary  Discussed breast self exam  Discussed calcium and vit D intake  Mammogram UTD  Colonoscopy UTD   Will return for gyn ultrasound

## 2017-06-28 NOTE — Patient Instructions (Signed)

## 2017-07-06 ENCOUNTER — Ambulatory Visit: Payer: 59 | Admitting: Obstetrics and Gynecology

## 2017-07-06 ENCOUNTER — Other Ambulatory Visit: Payer: Self-pay

## 2017-07-06 ENCOUNTER — Ambulatory Visit (INDEPENDENT_AMBULATORY_CARE_PROVIDER_SITE_OTHER): Payer: 59

## 2017-07-06 ENCOUNTER — Encounter: Payer: Self-pay | Admitting: Obstetrics and Gynecology

## 2017-07-06 VITALS — BP 124/78 | HR 80 | Resp 16 | Wt 214.0 lb

## 2017-07-06 DIAGNOSIS — N852 Hypertrophy of uterus: Secondary | ICD-10-CM | POA: Diagnosis not present

## 2017-07-06 DIAGNOSIS — D259 Leiomyoma of uterus, unspecified: Secondary | ICD-10-CM

## 2017-07-06 NOTE — Progress Notes (Signed)
GYNECOLOGY  VISIT   HPI: 61 y.o.   Married  [place] American  female   [phone] with Patient's last menstrual period was 08/03/2005.   here for follow up- enlarged uterus. The patient has a known fibroid uterus. At her annual exam last month her uterus felt larger than it did the year prior. No c/o.   GYNECOLOGIC HISTORY: Patient's last menstrual period was 08/03/2005. Contraception:postmenopause Menopausal hormone therapy: none         OB History    Gravida Para Term Preterm AB Living   4 3 3   1 3    SAB TAB Ectopic Multiple Live Births                     Patient Active Problem List   Diagnosis Date Noted  . Family history of breast cancer 06/18/2016  . GSI (genuine stress incontinence), female 06/18/2016    Past Medical History:  Diagnosis Date  . Allergy   . Anemia   . Dental crowns present   . Fibroid    uterus  . Heart murmur   . Lipoma of neck 08/2012   right    Past Surgical History:  Procedure Laterality Date  . CHOLECYSTECTOMY    . COLONOSCOPY    . LIPOMA EXCISION  08/26/2012   Procedure: EXCISION LIPOMA;  Surgeon: Rozetta Nunnery, MD;  Location: [facility];  Service: ENT;  Laterality: Right;  . TUBAL LIGATION  1990    Current Outpatient Medications  Medication Sig Dispense Refill  . Cholecalciferol (VITAMIN D3) 2000 units CHEW Chew by mouth.    . Probiotic Product (PROBIOTIC DAILY PO) Take 1 capsule by mouth daily.     Current Facility-Administered Medications  Medication Dose Route Frequency Provider Last Rate Last Dose  . 0.9 %  sodium chloride infusion  500 mL Intravenous Continuous Milus Banister, MD         ALLERGIES: Patient has no known allergies.  Family History  Problem Relation Age of Onset  . Anesthesia problems Sister        hard to wake up post-op  . Breast cancer Mother   . Diabetes Paternal Grandmother   . Diabetes Maternal Grandmother   . Colon cancer Neg Hx   . Colon polyps Neg Hx   . Esophageal  cancer Neg Hx   . Rectal cancer Neg Hx   . Stomach cancer Neg Hx     Social History   Socioeconomic History  . Marital status: Married    Spouse name: Not on file  . Number of children: Not on file  . Years of education: Not on file  . Highest education level: Not on file  Social Needs  . Financial resource strain: Not on file  . Food insecurity - worry: Not on file  . Food insecurity - inability: Not on file  . Transportation needs - medical: Not on file  . Transportation needs - non-medical: Not on file  Occupational History  . Not on file  Tobacco Use  . Smoking status: Never Smoker  . Smokeless tobacco: Never Used  Substance and Sexual Activity  . Alcohol use: No  . Drug use: No  . Sexual activity: Yes    Partners: Male    Birth control/protection: Post-menopausal  Other Topics Concern  . Not on file  Social History Narrative  . Not on file    Review of Systems  Constitutional: Negative.   HENT: Negative.  Eyes: Negative.   Respiratory: Negative.   Cardiovascular: Negative.   Gastrointestinal: Negative.   Genitourinary: Negative.   Musculoskeletal: Negative.   Skin: Negative.   Neurological: Negative.   Endo/Heme/Allergies: Negative.   Psychiatric/Behavioral: Negative.     PHYSICAL EXAMINATION:    BP 124/78 (BP Location: Right Arm, Patient Position: Sitting, Cuff Size: Normal)   Pulse 80   Resp 16   Wt 214 lb (97.1 kg)   LMP 08/03/2005   BMI 35.07 kg/m     General appearance: alert, cooperative and appears stated age  Ultrasound images reviewed with the patient. Multiple small myomas noted. Largest 4.1 cm   ASSESSMENT Postmenopausal female, ? Of enlargement of her uterus in the last year. Known fibroid uterus, ultrasound with several small myomas. No other ultrasound in the system (she had one years ago)    PLAN F/U exam in 6 months If stable plan routine f/u   An After Visit Summary was printed and given to the patient.

## 2017-07-13 DIAGNOSIS — H40013 Open angle with borderline findings, low risk, bilateral: Secondary | ICD-10-CM | POA: Diagnosis not present

## 2017-12-20 DIAGNOSIS — E7800 Pure hypercholesterolemia, unspecified: Secondary | ICD-10-CM | POA: Diagnosis not present

## 2017-12-20 DIAGNOSIS — D509 Iron deficiency anemia, unspecified: Secondary | ICD-10-CM | POA: Diagnosis not present

## 2017-12-20 DIAGNOSIS — E559 Vitamin D deficiency, unspecified: Secondary | ICD-10-CM | POA: Diagnosis not present

## 2017-12-20 DIAGNOSIS — R030 Elevated blood-pressure reading, without diagnosis of hypertension: Secondary | ICD-10-CM | POA: Diagnosis not present

## 2017-12-20 DIAGNOSIS — R7303 Prediabetes: Secondary | ICD-10-CM | POA: Diagnosis not present

## 2018-05-18 ENCOUNTER — Other Ambulatory Visit: Payer: Self-pay | Admitting: Family Medicine

## 2018-05-18 DIAGNOSIS — Z1231 Encounter for screening mammogram for malignant neoplasm of breast: Secondary | ICD-10-CM

## 2018-05-20 DIAGNOSIS — H2513 Age-related nuclear cataract, bilateral: Secondary | ICD-10-CM | POA: Diagnosis not present

## 2018-05-20 DIAGNOSIS — H25013 Cortical age-related cataract, bilateral: Secondary | ICD-10-CM | POA: Diagnosis not present

## 2018-05-20 DIAGNOSIS — H40013 Open angle with borderline findings, low risk, bilateral: Secondary | ICD-10-CM | POA: Diagnosis not present

## 2018-06-17 ENCOUNTER — Ambulatory Visit
Admission: RE | Admit: 2018-06-17 | Discharge: 2018-06-17 | Disposition: A | Discharge status: Home / Self Care | Payer: 59 | Source: Ambulatory Visit | Attending: Family Medicine | Admitting: Family Medicine

## 2018-06-17 DIAGNOSIS — Z1231 Encounter for screening mammogram for malignant neoplasm of breast: Secondary | ICD-10-CM

## 2018-06-24 ENCOUNTER — Ambulatory Visit: Payer: 59

## 2018-06-24 DIAGNOSIS — R030 Elevated blood-pressure reading, without diagnosis of hypertension: Secondary | ICD-10-CM | POA: Diagnosis not present

## 2018-06-24 DIAGNOSIS — E559 Vitamin D deficiency, unspecified: Secondary | ICD-10-CM | POA: Diagnosis not present

## 2018-06-24 DIAGNOSIS — D509 Iron deficiency anemia, unspecified: Secondary | ICD-10-CM | POA: Diagnosis not present

## 2018-06-24 DIAGNOSIS — R7303 Prediabetes: Secondary | ICD-10-CM | POA: Diagnosis not present

## 2018-06-24 DIAGNOSIS — E7800 Pure hypercholesterolemia, unspecified: Secondary | ICD-10-CM | POA: Diagnosis not present

## 2018-07-01 DIAGNOSIS — Z23 Encounter for immunization: Secondary | ICD-10-CM | POA: Diagnosis not present

## 2018-07-04 NOTE — Progress Notes (Signed)
62 y.o. [id number] Married Black or [place] American Not Hispanic or Latino female here for annual exam.  The patient has a known fibroid uterus, last year it felt enlarged on exam 8-10 week sized. Imaging with multiple small myomas.  She c/o some urgency to void, sometimes she doesn't feel empty, will need to double void at time. Some urge incontinence and stress incontinence. Only leaks with a full bladder, occurs a couple of times a month. No urinary frequency, no pain. Her urinary symptoms started in the last couple of months. She drinks 10-20 oz of coffee a day. She does think that she went from 1/2 caffeine to full caffeine around the time her urinary symptoms started.  Still having some hot flashes at night, tolerable.  Sexually active on occasion, no pain.  No vaginal bleeding.      Patient's last menstrual period was 08/03/2005.          Sexually active: Yes.    The current method of family planning is tubal ligation.    Exercising: Yes.    treadmill Smoker:  no  Health Maintenance: Pap:  06-18-16 WNL NEG HR HPV, 04-24-13 WNL NEG HR HPV History of abnormal Pap: Yes years ago MMG:  06/17/2018 [facility] 1 negative Colonoscopy:  04-28-17 polyps, f/u in 5 years  BMD:   Never TDaP:  Unsure, thinks UTD Gardasil: N/A    reports that she has never smoked. She has never used smokeless tobacco. She reports that she does not drink alcohol or use drugs. 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  3 sons (one local), one with multiple medical issues. 1 grandson, 72 years old (local).  Past Medical History:  Diagnosis Date  . Allergy   . Anemia   . Dental crowns present   . Fibroid    uterus  . Heart murmur   . Lipoma of neck 08/2012   right    Past Surgical History:  Procedure Laterality Date  . CHOLECYSTECTOMY    . COLONOSCOPY    . LIPOMA EXCISION  08/26/2012   Procedure: EXCISION LIPOMA;  Surgeon: Rozetta Nunnery, MD;  Location: [facility];   Service: ENT;  Laterality: Right;  . TUBAL LIGATION  1990    Current Outpatient Medications  Medication Sig Dispense Refill  . Cholecalciferol (VITAMIN D3) 2000 units CHEW Chew by mouth.    . Probiotic Product (PROBIOTIC DAILY PO) Take 1 capsule by mouth daily.     Current Facility-Administered Medications  Medication Dose Route Frequency Provider Last Rate Last Dose  . 0.9 %  sodium chloride infusion  500 mL Intravenous Continuous Milus Banister, MD        Family History  Problem Relation Age of Onset  . Anesthesia problems Sister        hard to wake up post-op  . Breast cancer Mother   . Diabetes Paternal Grandmother   . Diabetes Maternal Grandmother   . Colon cancer Neg Hx   . Colon polyps Neg Hx   . Esophageal cancer Neg Hx   . Rectal cancer Neg Hx   . Stomach cancer Neg Hx     Review of Systems  Constitutional: Negative.   HENT: Negative.   Eyes: Negative.   Respiratory: Negative.   Cardiovascular: Negative.   Gastrointestinal: Negative.   Endocrine: Negative.   Genitourinary: Positive for urgency.       Nocturia  Musculoskeletal: Negative.   Skin: Positive for rash.  Neurological: Negative.   Hematological: Negative.   Psychiatric/Behavioral: Negative.     Exam:   BP 128/86 (BP Location: Right Arm, Patient Position: Sitting, Cuff Size: Normal)   Pulse 64   Ht 5\' 5"  (1.651 m)   Wt 217 lb (98.4 kg)   LMP 08/03/2005   BMI 36.11 kg/m   Weight change: @WEIGHTCHANGE @ Height:   Height: 5\' 5"  (165.1 cm)  Ht Readings from Last 3 Encounters:  07/07/18 5\' 5"  (1.651 m)  06/28/17 5' 5.5" (1.664 m)  04/28/17 5' 5.5" (1.664 m)    General appearance: alert, cooperative and appears stated age Head: Normocephalic, without obvious abnormality, atraumatic Neck: no adenopathy, supple, symmetrical, trachea midline and thyroid normal to inspection and palpation Lungs: clear to auscultation bilaterally Cardiovascular: regular rate and rhythm Breasts: normal appearance,  no masses or tenderness Abdomen: soft, non-tender; non distended,  no masses,  no organomegaly Extremities: extremities normal, atraumatic, no cyanosis or edema Skin: Skin color, texture, turgor normal. No rashes or lesions Lymph nodes: Cervical, supraclavicular, and axillary nodes normal. No abnormal inguinal nodes palpated Neurologic: Grossly normal   Pelvic: External genitalia:  no lesions              Urethra:  normal appearing urethra with no masses, tenderness or lesions              Bartholins and Skenes: normal                 Vagina: normal appearing vagina with normal color and discharge, no lesions              Cervix: no lesions               Bimanual Exam:  Uterus:  anteverted, mobile, 8-10 week sized, unchanged.               Adnexa: no mass, fullness, tenderness               Rectovaginal: Confirms               Anus:  normal sphincter tone, no lesions  Chaperone was present for exam.  A:  Well Woman with normal exam  Mixed incontinence, mild and tolerable  Urinary urgency  Fibroid uterus, stable  P:   Pap next year  Mammogram just done  Colonoscopy UTD  Discussed breast self exam  Discussed calcium and vit D intake  Send urine for culture  Cut back on caffeine  Discussed kegels  Discussed the option of medication for OAB

## 2018-07-07 ENCOUNTER — Other Ambulatory Visit: Payer: Self-pay

## 2018-07-07 ENCOUNTER — Encounter: Payer: Self-pay | Admitting: Obstetrics and Gynecology

## 2018-07-07 ENCOUNTER — Ambulatory Visit: Payer: 59 | Admitting: Obstetrics and Gynecology

## 2018-07-07 VITALS — BP 128/86 | HR 64 | Ht 65.0 in | Wt 217.0 lb

## 2018-07-07 DIAGNOSIS — N3946 Mixed incontinence: Secondary | ICD-10-CM

## 2018-07-07 DIAGNOSIS — Z01419 Encounter for gynecological examination (general) (routine) without abnormal findings: Secondary | ICD-10-CM | POA: Diagnosis not present

## 2018-07-07 DIAGNOSIS — R3915 Urgency of urination: Secondary | ICD-10-CM | POA: Diagnosis not present

## 2018-07-07 DIAGNOSIS — D259 Leiomyoma of uterus, unspecified: Secondary | ICD-10-CM | POA: Diagnosis not present

## 2018-07-07 LAB — POCT URINALYSIS DIPSTICK
Bilirubin, UA: NEGATIVE
Blood, UA: POSITIVE
Glucose, UA: NEGATIVE
Ketones, UA: NEGATIVE
LEUKOCYTES UA: NEGATIVE
Nitrite, UA: NEGATIVE
Protein, UA: NEGATIVE
Spec Grav, UA: 1.01 (ref 1.010–1.025)
Urobilinogen, UA: 0.2 E.U./dL
pH, UA: 5 (ref 5.0–8.0)

## 2018-07-07 NOTE — Patient Instructions (Signed)
EXERCISE AND DIET:  We recommended that you start or continue a regular exercise program for good health. Regular exercise means any activity that makes your heart beat faster and makes you sweat.  We recommend exercising at least 30 minutes per day at least 3 days a week, preferably 4 or 5.  We also recommend a diet low in fat and sugar.  Inactivity, poor dietary choices and obesity can cause diabetes, heart attack, stroke, and kidney damage, among others.    ALCOHOL AND SMOKING:  Women should limit their alcohol intake to no more than 7 drinks/beers/glasses of wine (combined, not each!) per week. Moderation of alcohol intake to this level decreases your risk of breast cancer and liver damage. And of course, no recreational drugs are part of a healthy lifestyle.  And absolutely no smoking or even second hand smoke. Most people know smoking can cause heart and lung diseases, but did you know it also contributes to weakening of your bones? Aging of your skin?  Yellowing of your teeth and nails?  CALCIUM AND VITAMIN D:  Adequate intake of calcium and Vitamin D are recommended.  The recommendations for exact amounts of these supplements seem to change often, but generally speaking 600 mg of calcium (either carbonate or citrate) and 800 units of Vitamin D per day seems prudent. Certain women may benefit from higher intake of Vitamin D.  If you are among these women, your doctor will have told you during your visit.    PAP SMEARS:  Pap smears, to check for cervical cancer or precancers,  have traditionally been done yearly, although recent scientific advances have shown that most women can have pap smears less often.  However, every woman still should have a physical exam from her gynecologist every year. It will include a breast check, inspection of the vulva and vagina to check for abnormal growths or skin changes, a visual exam of the cervix, and then an exam to evaluate the size and shape of the uterus and  ovaries.  And after 63 years of age, a rectal exam is indicated to check for rectal cancers. We will also provide age appropriate advice regarding health maintenance, like when you should have certain vaccines, screening for sexually transmitted diseases, bone density testing, colonoscopy, mammograms, etc.   MAMMOGRAMS:  All women over 40 years old should have a yearly mammogram. Many facilities now offer a "3D" mammogram, which may cost around $50 extra out of pocket. If possible,  we recommend you accept the option to have the 3D mammogram performed.  It both reduces the number of women who will be called back for extra views which then turn out to be normal, and it is better than the routine mammogram at detecting truly abnormal areas.    COLONOSCOPY:  Colonoscopy to screen for colon cancer is recommended for all women at age 50.  We know, you hate the idea of the prep.  We agree, BUT, having colon cancer and not knowing it is worse!!  Colon cancer so often starts as a polyp that can be seen and removed at colonscopy, which can quite literally save your life!  And if your first colonoscopy is normal and you have no family history of colon cancer, most women don't have to have it again for 10 years.  Once every ten years, you can do something that may end up saving your life, right?  We will be happy to help you get it scheduled when you are ready.    Be sure to check your insurance coverage so you understand how much it will cost.  It may be covered as a preventative service at no cost, but you should check your particular policy.      Overactive Bladder, Adult Overactive bladder is a group of urinary symptoms. With overactive bladder, you may suddenly feel the need to pass urine (urinate) right away. After feeling this sudden urge, you might also leak urine if you cannot get to the bathroom fast enough (urinary incontinence). These symptoms might interfere with your daily work or social activities.  Overactive bladder symptoms may also wake you up at night. Overactive bladder affects the nerve signals between your bladder and your brain. Your bladder may get the signal to empty before it is full. Very sensitive muscles can also make your bladder squeeze too soon. What are the causes? Many things can cause an overactive bladder. Possible causes include:  Urinary tract infection.  Infection of nearby tissues, such as the prostate.  Prostate enlargement.  Being pregnant with twins or more (multiples).  Surgery on the uterus or urethra.  Bladder stones, inflammation, or tumors.  Drinking too much caffeine or alcohol.  Certain medicines, especially those that you take to help your body get rid of extra fluid (diuretics) by increasing urine production.  Muscle or nerve weakness, especially from: ? A spinal cord injury. ? Stroke. ? Multiple sclerosis. ? Parkinson disease.  Diabetes. This can cause a high urine volume that fills the bladder so quickly that the normal urge to urinate is triggered very strongly.  Constipation. A buildup of too much stool can put pressure on your bladder.  What increases the risk? You may be at greater risk for overactive bladder if you:  Are an older adult.  Smoke.  Are going through menopause.  Have prostate problems.  Have a neurological disease, such as stroke, dementia, Parkinson disease, or multiple sclerosis (MS).  Eat or drink things that irritate the bladder. These include alcohol, spicy food, and caffeine.  Are overweight or obese.  What are the signs or symptoms? The signs and symptoms of an overactive bladder include:  Sudden, strong urges to urinate.  Leaking urine.  Urinating eight or more times per day.  Waking up to urinate two or more times per night.  How is this diagnosed? Your health care provider may suspect overactive bladder based on your symptoms. The health care provider will do a physical exam and take  your medical history. Blood or urine tests may also be done. For example, you might need to have a bladder function test to check how well you can hold your urine. You might also need to see a health care provider who specializes in the urinary tract (urologist). How is this treated? Treatment for overactive bladder depends on the cause of your condition and whether it is mild or severe. Certain treatments can be done in your health care provider's office or clinic. You can also make lifestyle changes at home. Options include: Behavioral Treatments  Biofeedback. A specialist uses sensors to help you become aware of your body's signals.  Keeping a daily log of when you need to urinate and what happens after the urge. This may help you manage your condition.  Bladder training. This helps you learn to control the urge to urinate by following a schedule that directs you to urinate at regular intervals (timed voiding). At first, you might have to wait a few minutes after feeling the urge. In time,  you should be able to schedule bathroom visits an hour or more apart.  Kegel exercises. These are exercises to strengthen the pelvic floor muscles, which support the bladder. Toning these muscles can help you control urination, even if your bladder muscles are overactive. A specialist will teach you how to do these exercises correctly. They require daily practice.  Weight loss. If you are obese or overweight, losing weight might relieve your symptoms of overactive bladder. Talk to your health care provider about losing weight and whether there is a specific program or method that would work best for you.  Diet change. This might help if constipation is making your overactive bladder worse. Your health care provider or a dietitian can explain ways to change what you eat to ease constipation. You might also need to consume less alcohol and caffeine or drink other fluids at different times of the day.  Stopping  smoking.  Wearing pads to absorb leakage while you wait for other treatments to take effect. Physical Treatments  Electrical stimulation. Electrodes send gentle pulses of electricity to strengthen the nerves or muscles that help to control the bladder. Sometimes, the electrodes are placed outside of the body. In other cases, they might be placed inside the body (implanted). This treatment can take several months to have an effect.  Supportive devices. Women may need a plastic device that fits into the vagina and supports the bladder (pessary). Medicines Several medicines can help treat overactive bladder and are usually used along with other treatments. Some are injected into the muscles involved in urination. Others come in pill form. Your health care provider may prescribe:  Antispasmodics. These medicines block the signals that the nerves send to the bladder. This keeps the bladder from releasing urine at the wrong time.  Tricyclic antidepressants. These types of antidepressants also relax bladder muscles.  Surgery  You may have a device implanted to help manage the nerve signals that indicate when you need to urinate.  You may have surgery to implant electrodes for electrical stimulation.  Sometimes, very severe cases of overactive bladder require surgery to change the shape of the bladder. Follow these instructions at home:  Take medicines only as directed by your health care provider.  Use any implants or a pessary as directed by your health care provider.  Make any diet or lifestyle changes that are recommended by your health care provider. These might include: ? Drinking less fluid or drinking at different times of the day. If you need to urinate often during the night, you may need to stop drinking fluids early in the evening. ? Cutting down on caffeine or alcohol. Both can make an overactive bladder worse. Caffeine is found in coffee, tea, and sodas. ? Doing Kegel exercises  to strengthen muscles. ? Losing weight if you need to. ? Eating a healthy and balanced diet to prevent constipation.  Keep a journal or log to track how much and when you drink and also when you feel the need to urinate. This will help your health care provider to monitor your condition. Contact a health care provider if:  Your symptoms do not get better after treatment.  Your pain and discomfort are getting worse.  You have more frequent urges to urinate.  You have a fever. Get help right away if: You are not able to control your bladder at all. This information is not intended to replace advice given to you by your health care provider. Make sure you discuss any questions  you have with your health care provider. Document Released: 05/16/2009 Document Revised: 12/26/2015 Document Reviewed: 12/13/2013 Elsevier Interactive Patient Education  [employer].

## 2018-07-08 LAB — URINALYSIS, MICROSCOPIC ONLY
Bacteria, UA: NONE SEEN
CASTS: NONE SEEN /LPF

## 2018-07-09 LAB — URINE CULTURE: Organism ID, Bacteria: NO GROWTH

## 2018-11-25 ENCOUNTER — Ambulatory Visit: Payer: Self-pay

## 2018-11-25 ENCOUNTER — Telehealth: Payer: Self-pay | Admitting: Family Medicine

## 2018-11-25 NOTE — Telephone Encounter (Signed)
Pt called asking where she can get screened for Covid. Pt is asymptomatic. Pt stated that a coworker tested positive and her manager asked her to get tested. Pt informed the [facility] may do screening only for pts who  Are having severe symptoms and requiring medical assistance. Pt informed she cannot go to [place] just to be tested.  Advised pt to self quarantine fr 14 days. Not to go out shopping, have visitors, go to church. Wash her hands often especially after coughing or sneezing. Drink plenty of fluids, treat symptoms and to seek medical help if having difficulty breathing or if SOB.   Reason for Disposition . COVID-19 Testing, questions about  Answer Assessment - Initial Assessment Questions 1. CLOSE CONTACT: "Who is the person with the confirmed or suspected COVID-19 infection that you were exposed to?"     Secretary at office 2. PLACE of CONTACT: "Where were you when you were exposed to COVID-19?" (e.g., home, school, medical waiting room; which city?)     work 3. TYPE of CONTACT: "How much contact was there?" (e.g., sitting next to, live in same house, work in same office, same building)     Pt works part time in an office  4. 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 DATE of CONTACT: "When did you have contact with a COVID-19 patient?" (e.g., how many days ago)    Last Contact was 11/18/18 6. TRAVEL: "Have you traveled out of the country recently?" If so, "When and where?"     * Also ask about out-of-state travel, since the CDC has identified some high risk cities for community spread in the [place].     * Note: Travel becomes less relevant if there is widespread community transmission where the patient lives.     no 7. COMMUNITY SPREAD: "Are there lots of cases or COVID-19 (community spread) where you  live?" (See public health department website, if unsure)   * MAJOR community spread: high number of cases; numbers of cases are increasing; many people hospitalized.   * MINOR community spread: low number of cases; not increasing; few or no people hospitalized     major 8. SYMPTOMS: "Do you have any symptoms?" (e.g., fever, cough, breathing difficulty)     no 9. PREGNANCY OR POSTPARTUM: "Is there any chance you are pregnant?" "When was your last menstrual period?" "Did you deliver in the last 2 weeks?"     n/a 10. HIGH RISK: "Do you have any heart or lung problems? Do you have a weak immune system?" (e.g., CHF, COPD, asthma, HIV positive, chemotherapy, renal failure, diabetes mellitus, sickle cell anemia)       no  Protocols used: CORONAVIRUS (COVID-19) EXPOSURE-A-AH

## 2018-11-25 NOTE — Telephone Encounter (Signed)
Pt's PCP is a Nutritional therapist. Chart opened in error.

## 2018-11-29 DIAGNOSIS — B349 Viral infection, unspecified: Secondary | ICD-10-CM | POA: Diagnosis not present

## 2019-06-14 ENCOUNTER — Other Ambulatory Visit: Payer: Self-pay | Admitting: Family Medicine

## 2019-06-14 DIAGNOSIS — Z1231 Encounter for screening mammogram for malignant neoplasm of breast: Secondary | ICD-10-CM

## 2019-07-11 NOTE — Progress Notes (Signed)
63 y.o. [id number] Married Black or [place] American Not Hispanic or Latino female here for annual exam. No vaginal bleeding. Not sexually active, ED.   She has a known fibroid uterus.    Her bladder leakage is better since cutting back on caffeine.   She c/o abdominal distention and upper abdominal discomfort if she goes 6 hours without eating and then eats. She had her gallbladder removed years ago.     Patient's last menstrual period was 08/03/2005.          Sexually active: No.  The current method of family planning is post menopausal status.    Exercising: Yes.    walking Smoker:  no  Health Maintenance: Pap:06-18-16 WNL NEG HR HPV, 04-24-13 WNL NEG HR HPV History of abnormal Pap:Yes years ago MMG:06/17/2018 Birads 1 negative, she has an appointment in 1/21.  Colonoscopy:04-28-17 polyps, f/u in 5 years [id number] TDaP:Unsure, thinks UTD Gardasil:N/A    reports that she has never smoked. She has never used smokeless tobacco. She reports that she does not drink alcohol or use drugs. 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  3 sons (one local), one with multiple medical issues. 1 grandson, 22 years old (local).  Past Medical History:  Diagnosis Date  . Allergy   . Anemia   . Dental crowns present   . Fibroid    uterus  . Heart murmur   . Lipoma of neck 08/2012   right    Past Surgical History:  Procedure Laterality Date  . CHOLECYSTECTOMY    . COLONOSCOPY    . LIPOMA EXCISION  08/26/2012   Procedure: EXCISION LIPOMA;  Surgeon: Rozetta Nunnery, MD;  Location: [facility];  Service: ENT;  Laterality: Right;  . TUBAL LIGATION  1990    Current Outpatient Medications  Medication Sig Dispense Refill  . Cholecalciferol (VITAMIN D3) 2000 units CHEW Chew by mouth.     Current Facility-Administered Medications  Medication Dose Route Frequency Provider Last Rate Last Admin  . 0.9 %  sodium chloride infusion  500 mL  Intravenous Continuous Milus Banister, MD        Family History  Problem Relation Age of Onset  . Anesthesia problems Sister        hard to wake up post-op  . Breast cancer Mother   . Diabetes Paternal Grandmother   . Diabetes Maternal Grandmother   . Colon cancer Neg Hx   . Colon polyps Neg Hx   . Esophageal cancer Neg Hx   . Rectal cancer Neg Hx   . Stomach cancer Neg Hx     Review of Systems  Constitutional: Negative.   HENT: Negative.   Eyes: Negative.   Respiratory: Negative.   Cardiovascular: Negative.   Gastrointestinal: Positive for abdominal distention and abdominal pain.  Endocrine: Negative.   Genitourinary: Negative.   Musculoskeletal: Negative.   Skin: Negative.   Allergic/Immunologic: Negative.   Neurological: Negative.   Hematological: Negative.   Psychiatric/Behavioral: Negative.     Exam:   BP 118/70 (BP Location: Right Arm, Patient Position: Sitting, Cuff Size: Large)   Pulse 64   Temp (!) 97.1 F (36.2 C) (Skin)   Ht 5' 9.29" (1.76 m)   Wt 213 lb 9.6 oz (96.9 kg)   LMP 08/03/2005   BMI 31.28 kg/m   Weight change: @WEIGHTCHANGE @ Height:   Height: 5' 9.29" (176 cm)  Ht Readings from Last 3 Encounters:  07/14/19 5' 9.29" (1.76  m)  07/07/18 5\' 5"  (1.651 m)  06/28/17 5' 5.5" (1.664 m)    General appearance: alert, cooperative and appears stated age Head: Normocephalic, without obvious abnormality, atraumatic Neck: no adenopathy, supple, symmetrical, trachea midline and thyroid normal to inspection and palpation Lungs: clear to auscultation bilaterally Cardiovascular: regular rate and rhythm Breasts: normal appearance, no masses or tenderness Abdomen: soft, non-tender; non distended,  no masses,  no organomegaly Extremities: extremities normal, atraumatic, no cyanosis or edema Skin: Skin color, texture, turgor normal. No rashes or lesions Lymph nodes: Cervical, supraclavicular, and axillary nodes normal. No abnormal inguinal nodes  palpated Neurologic: Grossly normal   Pelvic: External genitalia:  no lesions              Urethra:  normal appearing urethra with no masses, tenderness or lesions              Bartholins and Skenes: normal                 Vagina: normal appearing vagina with normal color and discharge, no lesions              Cervix: no lesions               Bimanual Exam:  Uterus:  anteverted, mobile, 8-10 week sized, not tender              Adnexa: no mass, fullness, tenderness               Rectovaginal: Confirms               Anus:  normal sphincter tone, no lesions  Chaperone was present for exam.  A:  Well Woman with normal exam  Fibroid uterus, stable  Upper abdominal discomfort and distention, she will f/u with her primary  P:   Mammogram scheduled  Colonoscopy UTD  Labs with primary  Discussed breast self exam  Discussed calcium and vit D intake

## 2019-07-12 ENCOUNTER — Other Ambulatory Visit: Payer: Self-pay

## 2019-07-13 ENCOUNTER — Ambulatory Visit: Payer: 59 | Admitting: Obstetrics and Gynecology

## 2019-07-14 ENCOUNTER — Ambulatory Visit: Payer: 59 | Admitting: Obstetrics and Gynecology

## 2019-07-14 ENCOUNTER — Encounter: Payer: Self-pay | Admitting: Obstetrics and Gynecology

## 2019-07-14 ENCOUNTER — Other Ambulatory Visit: Payer: Self-pay

## 2019-07-14 VITALS — BP 118/70 | HR 64 | Temp 97.1°F | Ht 69.29 in | Wt 213.6 lb

## 2019-07-14 DIAGNOSIS — D259 Leiomyoma of uterus, unspecified: Secondary | ICD-10-CM

## 2019-07-14 DIAGNOSIS — Z01419 Encounter for gynecological examination (general) (routine) without abnormal findings: Secondary | ICD-10-CM | POA: Diagnosis not present

## 2019-07-14 NOTE — Patient Instructions (Signed)

## 2019-08-09 ENCOUNTER — Other Ambulatory Visit: Payer: Self-pay

## 2019-08-09 ENCOUNTER — Ambulatory Visit
Admission: RE | Admit: 2019-08-09 | Discharge: 2019-08-09 | Disposition: A | Discharge status: Home / Self Care | Payer: 59 | Source: Ambulatory Visit | Attending: Family Medicine | Admitting: Family Medicine

## 2019-08-09 DIAGNOSIS — Z1231 Encounter for screening mammogram for malignant neoplasm of breast: Secondary | ICD-10-CM

## 2020-07-16 NOTE — Progress Notes (Signed)
64 y.o. [id number] Married Black or [place] American Not Hispanic or Latino female here for annual exam.  She has a known fibroid uterus. No vaginal bleeding. Not sexually active secondary to ED.  She is having an upper GI in 1/22, she is having pain in her upper abdomen if she goes too long without eating. Protonix is helping some.  She has had some constipation with the Protonix. Recently better.  No bladder c/o.     Patient's last menstrual period was 08/03/2005.          Sexually active: No.  The current method of family planning is post menopausal status.    Exercising: Yes.    Gym/ health club routine includes cardio. Smoker:  no  Health Maintenance: Pap:  06-18-16 WNL NEG HR HPV,04-24-13 WNL NEG HR HPV History of abnormal Pap:  Yes years ago  MMG:  08/09/19 density B bi-rads 1 Neg BMD:   never Colonoscopy: 04-28-17 polyps, f/u in 5 years TDaP:  Unsure thinks its up to date  Gardasil: NA   reports that she has never smoked. She has never used smokeless tobacco. She reports that she does not drink alcohol and does not use drugs. 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 1 grandson, 40 years old (local). Her grandsons mother died in 2023/04/13 of covid (not vaccinated). Her grandson is currently living with her, her son isn't in great health (obese, CHF, diabetic).   Past Medical History:  Diagnosis Date  . Allergy   . Anemia   . Dental crowns present   . Fibroid    uterus  . Heart murmur   . Lipoma of neck 08/2012   right    Past Surgical History:  Procedure Laterality Date  . CHOLECYSTECTOMY    . COLONOSCOPY    . LIPOMA EXCISION  08/26/2012   Procedure: EXCISION LIPOMA;  Surgeon: Rozetta Nunnery, MD;  Location: [facility];  Service: ENT;  Laterality: Right;  . TUBAL LIGATION  1990    Current Outpatient Medications  Medication Sig Dispense Refill  . Cholecalciferol (VITAMIN D3) 2000 units CHEW Chew by mouth.    . pantoprazole  (PROTONIX) 40 MG tablet 1 tablet    . rosuvastatin (CRESTOR) 20 MG tablet 1 tablet     Current Facility-Administered Medications  Medication Dose Route Frequency Provider Last Rate Last Admin  . 0.9 %  sodium chloride infusion  500 mL Intravenous Continuous Milus Banister, MD        Family History  Problem Relation Age of Onset  . Anesthesia problems Sister        hard to wake up post-op  . Breast cancer Mother   . Diabetes Paternal Grandmother   . Diabetes Maternal Grandmother   . Colon cancer Neg Hx   . Colon polyps Neg Hx   . Esophageal cancer Neg Hx   . Rectal cancer Neg Hx   . Stomach cancer Neg Hx   Mom was 66 with breast cancer. Died at 3.   Review of Systems  All other systems reviewed and are negative.   Exam:   BP 118/64   Pulse 60   Ht 5' 5.5" (1.664 m)   Wt 214 lb (97.1 kg)   LMP 08/03/2005   SpO2 99%   BMI 35.07 kg/m   Weight change: @WEIGHTCHANGE @ Height:   Height: 5' 5.5" (166.4 cm)  Ht Readings from Last 3 Encounters:  07/17/20 5' 5.5" (1.664 m)  07/14/19 5' 9.29" (1.76 m)  07/07/18 5\' 5"  (1.651 m)    General appearance: alert, cooperative and appears stated age Head: Normocephalic, without obvious abnormality, atraumatic Neck: no adenopathy, supple, symmetrical, trachea midline and thyroid normal to inspection and palpation Lungs: clear to auscultation bilaterally Cardiovascular: regular rate and rhythm Breasts: normal appearance, no masses or tenderness Abdomen: soft, non-tender; non distended,  no masses,  no organomegaly Extremities: extremities normal, atraumatic, no cyanosis or edema Skin: Skin color, texture, turgor normal. No rashes or lesions Lymph nodes: Cervical, supraclavicular, and axillary nodes normal. No abnormal inguinal nodes palpated Neurologic: Grossly normal   Pelvic: External genitalia:  no lesions              Urethra:  normal appearing urethra with no masses, tenderness or lesions              Bartholins and Skenes:  normal                 Vagina: normal appearing vagina with normal color and discharge, no lesions              Cervix: no lesions               Bimanual Exam:  Uterus:  anteverted, mobile, 8 week sized (stable), not tender              Adnexa: no mass, fullness, tenderness               Rectovaginal: Confirms               Anus:  normal sphincter tone, no lesions  Karmen Bongo chaperoned for the exam.  A:  Well Woman with normal exam  FH of breast cancer, discussed genetic counseling. At the moment she declines, understands that it could change her screening recommendations.   P:   No pap this year  Mammogram next month  Discussed breast self exam  Discussed calcium and vit D intake  Labs with primary  Colonoscopy UTD

## 2020-07-17 ENCOUNTER — Ambulatory Visit (INDEPENDENT_AMBULATORY_CARE_PROVIDER_SITE_OTHER): Payer: Medicare Other | Admitting: Obstetrics and Gynecology

## 2020-07-17 ENCOUNTER — Other Ambulatory Visit: Payer: Self-pay

## 2020-07-17 ENCOUNTER — Encounter: Payer: Self-pay | Admitting: Obstetrics and Gynecology

## 2020-07-17 VITALS — BP 118/64 | HR 60 | Ht 65.5 in | Wt 214.0 lb

## 2020-07-17 DIAGNOSIS — L659 Nonscarring hair loss, unspecified: Secondary | ICD-10-CM | POA: Insufficient documentation

## 2020-07-17 DIAGNOSIS — R635 Abnormal weight gain: Secondary | ICD-10-CM | POA: Insufficient documentation

## 2020-07-17 DIAGNOSIS — Z860100 Personal history of colon polyps, unspecified: Secondary | ICD-10-CM | POA: Insufficient documentation

## 2020-07-17 DIAGNOSIS — R7303 Prediabetes: Secondary | ICD-10-CM | POA: Insufficient documentation

## 2020-07-17 DIAGNOSIS — E559 Vitamin D deficiency, unspecified: Secondary | ICD-10-CM | POA: Insufficient documentation

## 2020-07-17 DIAGNOSIS — R030 Elevated blood-pressure reading, without diagnosis of hypertension: Secondary | ICD-10-CM | POA: Insufficient documentation

## 2020-07-17 DIAGNOSIS — Z01419 Encounter for gynecological examination (general) (routine) without abnormal findings: Secondary | ICD-10-CM

## 2020-07-17 DIAGNOSIS — E7800 Pure hypercholesterolemia, unspecified: Secondary | ICD-10-CM | POA: Insufficient documentation

## 2020-07-17 DIAGNOSIS — D259 Leiomyoma of uterus, unspecified: Secondary | ICD-10-CM | POA: Insufficient documentation

## 2020-07-17 DIAGNOSIS — D509 Iron deficiency anemia, unspecified: Secondary | ICD-10-CM | POA: Insufficient documentation

## 2020-07-17 DIAGNOSIS — E669 Obesity, unspecified: Secondary | ICD-10-CM | POA: Insufficient documentation

## 2020-07-17 DIAGNOSIS — R1013 Epigastric pain: Secondary | ICD-10-CM | POA: Insufficient documentation

## 2020-07-17 DIAGNOSIS — R232 Flushing: Secondary | ICD-10-CM | POA: Insufficient documentation

## 2020-07-17 NOTE — Patient Instructions (Signed)

## 2020-08-12 ENCOUNTER — Other Ambulatory Visit: Payer: Self-pay | Admitting: Family Medicine

## 2020-08-12 DIAGNOSIS — Z1231 Encounter for screening mammogram for malignant neoplasm of breast: Secondary | ICD-10-CM

## 2020-08-28 ENCOUNTER — Ambulatory Visit
Admission: RE | Admit: 2020-08-28 | Discharge: 2020-08-28 | Disposition: A | Discharge status: Home / Self Care | Payer: 59 | Source: Ambulatory Visit | Attending: Family Medicine | Admitting: Family Medicine

## 2020-08-28 ENCOUNTER — Other Ambulatory Visit: Payer: Self-pay

## 2020-08-28 DIAGNOSIS — Z1231 Encounter for screening mammogram for malignant neoplasm of breast: Secondary | ICD-10-CM

## 2021-09-03 ENCOUNTER — Other Ambulatory Visit: Payer: Self-pay | Admitting: Family Medicine

## 2021-09-03 DIAGNOSIS — Z1231 Encounter for screening mammogram for malignant neoplasm of breast: Secondary | ICD-10-CM

## 2021-09-04 ENCOUNTER — Ambulatory Visit
Admission: RE | Admit: 2021-09-04 | Discharge: 2021-09-04 | Disposition: A | Discharge status: Home / Self Care | Payer: Medicare Other | Source: Ambulatory Visit | Attending: Family Medicine | Admitting: Family Medicine

## 2021-09-04 DIAGNOSIS — Z1231 Encounter for screening mammogram for malignant neoplasm of breast: Secondary | ICD-10-CM

## 2022-01-22 ENCOUNTER — Ambulatory Visit: Payer: Medicare Other | Attending: Family Medicine | Admitting: Physical Therapy

## 2022-01-22 ENCOUNTER — Encounter: Payer: Self-pay | Admitting: Physical Therapy

## 2022-01-22 DIAGNOSIS — M6281 Muscle weakness (generalized): Secondary | ICD-10-CM

## 2022-01-22 DIAGNOSIS — R29898 Other symptoms and signs involving the musculoskeletal system: Secondary | ICD-10-CM

## 2022-01-22 DIAGNOSIS — M533 Sacrococcygeal disorders, not elsewhere classified: Secondary | ICD-10-CM | POA: Diagnosis present

## 2022-01-22 NOTE — Therapy (Signed)
OUTPATIENT PHYSICAL THERAPY THORACOLUMBAR EVALUATION   Patient Name: Donna Le MRN: [id number] DOB:1955/08/15, 66 y.o., female Today's Date: 01/22/2022   PT End of Session - 01/22/22 1714     Visit Number 1    Number of Visits 13    Date for PT Re-Evaluation 03/05/22    Authorization Type [employer] MCR    Authorization Time Period 01/22/22 to 03/05/22    Progress Note Due on Visit 10    PT Start Time 1619    PT Stop Time 1700    PT Time Calculation (min) 41 min    Activity Tolerance Patient tolerated treatment well    Behavior During Therapy [facility] for tasks assessed/performed             Past Medical History:  Diagnosis Date   Allergy    Anemia    Dental crowns present    Fibroid    uterus   Heart murmur    Lipoma of neck 08/2012   right   Past Surgical History:  Procedure Laterality Date   CHOLECYSTECTOMY     COLONOSCOPY     LIPOMA EXCISION  08/26/2012   Procedure: EXCISION LIPOMA;  Surgeon: Rozetta Nunnery, MD;  Location: [place];  Service: ENT;  Laterality: Right;   [place]   Patient Active Problem List   Diagnosis Date Noted   Alopecia 07/17/2020   Elevated blood-pressure reading without diagnosis of hypertension 07/17/2020   Epigastric pain 07/17/2020   Hot flashes 07/17/2020   Hypercholesterolemia 07/17/2020   Iron deficiency anemia 07/17/2020   Obesity 07/17/2020   Personal history of colonic polyps 07/17/2020   Prediabetes 07/17/2020   Uterine leiomyoma 07/17/2020   Vitamin D deficiency 07/17/2020   Weight gain 07/17/2020   Family history of breast cancer 06/18/2016   GSI (genuine stress incontinence), female 06/18/2016    PCP: Yaakov Guthrie   REFERRING PROVIDER: Cari Caraway, MD   REFERRING DIAG: S39.012D (ICD-10-CM) - Strain of muscle, fascia and tendon of lower back, subsequent encounter   Rationale for Evaluation and Treatment Rehabilitation  THERAPY DIAG:  Muscle weakness (generalized) - Plan: PT  plan of care cert/re-cert  Other symptoms and signs involving the musculoskeletal system - Plan: PT plan of care cert/re-cert  SI (sacroiliac) pain - Plan: PT plan of care cert/re-cert  ONSET DATE: 83/15/1761   SUBJECTIVE:                                                                                                                                                                                           SUBJECTIVE STATEMENT: This started about a month  ago, I think it started from sitting in a really uncomfortable chair then that evening I pushed a cart and it felt worse. Its gotten a little better since I saw the doctor, she gave me some exercises to try and told me to use some heat. At this point the pain is not half as bad as it was but its still nagging on the left side. Its just annoying, I can do what I need and want. It tends to hurt more at night but does not keep me from sleeping, can get to about 3-4/10 at worst.   PERTINENT HISTORY:  HTN, hypercholesterolemia, obesity, pre-DM, hx cholecystectomy  PAIN:  Are you having pain? No 0/10   PRECAUTIONS: None  WEIGHT BEARING RESTRICTIONS No  FALLS:  Has patient fallen in last 6 months? 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   PLOF: Independent, Independent with basic ADLs, Needs assistance with gait, and Needs assistance with transfers  PATIENT GOALS get back to average daily living without any pain    OBJECTIVE:     SCREENING FOR RED FLAGS: Bowel or bladder incontinence: No Spinal tumors: No Cauda equina syndrome: No Compression fracture: No Abdominal aneurysm: No  COGNITION:  Overall cognitive status: Within functional limits for tasks assessed    OBSERVATIONS  L LE about 1/2 inch  longer than the R in supine      MUSCLE LENGTH: B hamstrings moderate limitation B piriformis moderate limitation  POSTURE: rounded shoulders, forward head, decreased lumbar lordosis, increased thoracic kyphosis, and flexed trunk   PALPATION: TTP around L SI joint line, no significant mm spasms or trigger points noted     LUMBAR ROM:   Active  A/PROM  eval  Flexion Moderate limitation   Extension WNL   Right lateral flexion WNL   Left lateral flexion Mild limitation  Right rotation   Left rotation    (Blank rows = not tested)  LOWER EXTREMITY MMT:    MMT Right eval Left eval  Hip flexion 4 3  Hip extension 3- 3  Hip abduction 3 3+  Hip adduction    Hip internal rotation    Hip external rotation    Knee flexion 5 5  Knee extension 5 5  Ankle dorsiflexion 5 5  Ankle plantarflexion    Ankle inversion    Ankle eversion     (Blank rows = not tested)  LUMBAR SPECIAL TESTS:  Straight leg raise test: Negative     TODAY'S TREATMENT  SI MET 6x5 second holds- able to 100% correct leg length discrepancy   Bridges x10 with 2 second hold  Sidelying clams red TB 1x10 B HS stretch 1x30 seconds B    PATIENT EDUCATION:  Education details: SI anatomy, exam findings, HEP, POC  Person educated: Patient Education method: Explanation, Demonstration, and Handouts Education comprehension: verbalized understanding and returned demonstration   HOME EXERCISE PROGRAM: [id number]  ASSESSMENT:  CLINICAL IMPRESSION: Patient is a 66 y.o. female who was seen today for physical therapy evaluation and treatment for SI pain. Exam revealed significant muscle weakness, postural impairment, core weakness, and leg length difference that was able to be corrected with MET today. Will benefit from skilled PT services to address impairments and continue to reduce pain moving forward.  OBJECTIVE IMPAIRMENTS Abnormal gait, decreased mobility, difficulty walking, decreased ROM, decreased  strength, hypomobility, increased fascial restrictions, impaired flexibility, postural dysfunction, and obesity.   ACTIVITY LIMITATIONS standing, squatting, sleeping, locomotion level, and caring for others  PARTICIPATION LIMITATIONS: cleaning, driving, shopping, community activity, occupation, and yard work  PERSONAL FACTORS Time since onset of injury/illness/exacerbation are also affecting patient's functional outcome.   REHAB POTENTIAL: Good  CLINICAL DECISION MAKING: Stable/uncomplicated  EVALUATION COMPLEXITY: Low   GOALS: Goals reviewed with patient? Yes  SHORT TERM GOALS: Target date: 02/12/2022  Will be compliant with HEP  Baseline: Goal status: INITIAL  2.  Will have better understanding of posture and ergonomics  Baseline:  Goal status: INITIAL  3.  Hamstring and piriformis flexibility impairment to have improved by 50%  Baseline:  Goal status: INITIAL   LONG TERM GOALS: Target date: 03/05/2022  MMT to improve by at least 1 grade in all weak groups  Baseline:  Goal status: INITIAL  2.  Night pain to be no more than 1/10 at worst  Baseline:  Goal status: INITIAL  3.  Will be independent with SI MET techniques PRN  Baseline:  Goal status: INITIAL  4.  Pain with functional task performance will be no more than 1/10 at worst  Baseline:  Goal status: INITIAL    PLAN: PT FREQUENCY: 2x/week  PT DURATION: 6 weeks  PLANNED INTERVENTIONS: Therapeutic exercises, Therapeutic activity, Neuromuscular re-education, Balance training, Gait training, Patient/Family education, Joint mobilization, Stair training, Orthotic/Fit training, Electrical stimulation, Spinal mobilization, Cryotherapy, Moist heat, Taping, Ultrasound, Ionotophoresis 36m/ml Dexamethasone, Manual therapy, and Re-evaluation.  PLAN FOR NEXT SESSION: check SI/MET PRN, strength and postural training    Bostyn Bogie U PT DPT PN2  01/22/2022, 5:17 PM

## 2022-01-26 ENCOUNTER — Encounter: Payer: Self-pay | Admitting: Physical Therapy

## 2022-01-26 ENCOUNTER — Ambulatory Visit: Payer: Medicare Other | Admitting: Physical Therapy

## 2022-01-26 DIAGNOSIS — R29898 Other symptoms and signs involving the musculoskeletal system: Secondary | ICD-10-CM

## 2022-01-26 DIAGNOSIS — M6281 Muscle weakness (generalized): Secondary | ICD-10-CM

## 2022-01-26 DIAGNOSIS — M533 Sacrococcygeal disorders, not elsewhere classified: Secondary | ICD-10-CM

## 2022-01-29 ENCOUNTER — Ambulatory Visit: Payer: Medicare Other | Admitting: Physical Therapy

## 2022-01-29 ENCOUNTER — Encounter: Payer: Self-pay | Admitting: Physical Therapy

## 2022-01-29 DIAGNOSIS — M6281 Muscle weakness (generalized): Secondary | ICD-10-CM | POA: Diagnosis not present

## 2022-01-29 DIAGNOSIS — M533 Sacrococcygeal disorders, not elsewhere classified: Secondary | ICD-10-CM

## 2022-01-29 DIAGNOSIS — R29898 Other symptoms and signs involving the musculoskeletal system: Secondary | ICD-10-CM

## 2022-01-29 NOTE — Therapy (Signed)
OUTPATIENT PHYSICAL THERAPY THORACOLUMBAR   Patient Name: Donna Le MRN: [id number] DOB:1955-10-09, 66 y.o., female Today's Date: 01/29/2022   PT End of Session - 01/29/22 1751     Visit Number 3    Date for PT Re-Evaluation 03/05/22    PT Start Time 1700    PT Stop Time 4315    PT Time Calculation (min) 47 min    Activity Tolerance Patient tolerated treatment well    Behavior During Therapy [facility] for tasks assessed/performed              Past Medical History:  Diagnosis Date   Allergy    Anemia    Dental crowns present    Fibroid    uterus   Heart murmur    Lipoma of neck 08/2012   right   Past Surgical History:  Procedure Laterality Date   CHOLECYSTECTOMY     COLONOSCOPY     LIPOMA EXCISION  08/26/2012   Procedure: EXCISION LIPOMA;  Surgeon: Rozetta Nunnery, MD;  Location: [place];  Service: ENT;  Laterality: Right;   [place]   Patient Active Problem List   Diagnosis Date Noted   Alopecia 07/17/2020   Elevated blood-pressure reading without diagnosis of hypertension 07/17/2020   Epigastric pain 07/17/2020   Hot flashes 07/17/2020   Hypercholesterolemia 07/17/2020   Iron deficiency anemia 07/17/2020   Obesity 07/17/2020   Personal history of colonic polyps 07/17/2020   Prediabetes 07/17/2020   Uterine leiomyoma 07/17/2020   Vitamin D deficiency 07/17/2020   Weight gain 07/17/2020   Family history of breast cancer 06/18/2016   GSI (genuine stress incontinence), female 06/18/2016    PCP: Yaakov Guthrie   REFERRING PROVIDER: Cari Caraway, MD   REFERRING DIAG: S39.012D (ICD-10-CM) - Strain of muscle, fascia and tendon of lower back, subsequent encounter   Rationale for Evaluation and Treatment Rehabilitation  THERAPY DIAG:  Muscle weakness (generalized)  Other symptoms and signs involving the musculoskeletal system  SI (sacroiliac) pain  ONSET DATE: 01/14/2022   SUBJECTIVE:                                                                                                                                                                                            SUBJECTIVE STATEMENT: I'm feeling good today, no pain.   PERTINENT HISTORY:  HTN, hypercholesterolemia, obesity, pre-DM, hx cholecystectomy  PAIN:  Are you having pain? No 0/10   PRECAUTIONS: None  WEIGHT BEARING RESTRICTIONS No  FALLS:  Has patient fallen in last 6 months? 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   PLOF: Independent, [place] with basic ADLs, Needs assistance with gait, and Needs assistance with transfers  PATIENT GOALS get back to average daily living without any pain    OBJECTIVE:     SCREENING FOR RED FLAGS: Bowel or bladder incontinence: No Spinal tumors: No Cauda equina syndrome: No Compression fracture: No Abdominal aneurysm: No  COGNITION:  Overall cognitive status: Within functional limits for tasks assessed    OBSERVATIONS  L LE about 1/2 inch longer than the R in supine      MUSCLE LENGTH: B hamstrings moderate limitation B piriformis moderate limitation  POSTURE: rounded shoulders, forward head, decreased lumbar lordosis, increased thoracic kyphosis, and flexed trunk   PALPATION: TTP around L SI joint line, no significant mm spasms or trigger points noted     LUMBAR ROM:   Active  A/PROM  eval  Flexion Moderate limitation   Extension WNL   Right lateral flexion WNL   Left lateral flexion Mild limitation  Right rotation   Left rotation    (Blank rows = not tested)  LOWER EXTREMITY MMT:    MMT Right eval Left eval  Hip flexion 4 3  Hip extension 3- 3  Hip abduction 3 3+  Hip adduction    Hip internal rotation    Hip external  rotation    Knee flexion 5 5  Knee extension 5 5  Ankle dorsiflexion 5 5  Ankle plantarflexion    Ankle inversion    Ankle eversion     (Blank rows = not tested)  LUMBAR SPECIAL TESTS:  Straight leg raise test: Negative     TODAY'S TREATMENT   01/29/22 NuStep L5 x 6 min Bridging x10 Bridge with hip abduction with red TB x10 Supine marches with red TB x10 B Ball squeeze 2x10 Trunk rotation x10 Isometric TA x10 KTC on ball 2x10 Rows with red TB x10 Rows 10# 2x10 Lat pull down 15# 2x10 Back extension/flexion with black TB 2x10 Passive hamstring/piriformis stretch 2x15"  01/26/22 Leg length re-assessment- equal Supine bridge with hip abd against Red Tband 10 reps. Isometric hip abd/add with 5 sec holds, 10 reps. PPT x 10, required mod VC and TC initially, but then able to complete. Supine march with PPT, 10 reps each. Stretching- piriformis, ITB, seated hamstring stretches for each leg, 3 x 15 sec.  Eval SI MET 6x5 second holds- able to 100% correct leg length discrepancy   Bridges x10 with 2 second hold  Sidelying clams red TB 1x10 B HS stretch 1x30 seconds B    PATIENT EDUCATION:  Education details: reviewed HS and Piriformis stretching Person educated: Patient Education method: Explanation, Demonstration, and Handouts Education comprehension: verbalized understanding and returned demonstration   HOME EXERCISE PROGRAM: [id number]  ASSESSMENT:  CLINICAL IMPRESSION: Pt enters today reporting no pain. States her back pain at home is most when she goes from sit to stand. Pt participated in several core strengthening exercises with good performance. Some verbal cues needed for posture for rows and back extension. Pt noted to have very tight hamstrings and would benefit from additional stretching and strengthening.   OBJECTIVE IMPAIRMENTS Abnormal gait, decreased mobility, difficulty walking, decreased ROM, decreased strength, hypomobility, increased fascial  restrictions, impaired flexibility, postural dysfunction, and obesity.   ACTIVITY LIMITATIONS standing, squatting, sleeping, locomotion level, and caring  for others  PARTICIPATION LIMITATIONS: cleaning, driving, shopping, community activity, occupation, and yard work  PERSONAL FACTORS Time since onset of injury/illness/exacerbation are also affecting patient's functional outcome.   REHAB POTENTIAL: Good  CLINICAL DECISION MAKING: Stable/uncomplicated  EVALUATION COMPLEXITY: Low   GOALS: Goals reviewed with patient? Yes  SHORT TERM GOALS: Target date: 02/19/2022  Will be compliant with HEP  Baseline: Updated Goal status: Progressing  2.  Will have better understanding of posture and ergonomics  Baseline:  Goal status: INITIAL  3.  Hamstring and piriformis flexibility impairment to have improved by 50%  Baseline:  Goal status: INITIAL   LONG TERM GOALS: Target date: 03/12/2022  MMT to improve by at least 1 grade in all weak groups  Baseline:  Goal status: INITIAL  2.  Night pain to be no more than 1/10 at worst  Baseline:  Goal status: INITIAL  3.  Will be independent with SI MET techniques PRN  Baseline:  Goal status: INITIAL  4.  Pain with functional task performance will be no more than 1/10 at worst  Baseline:  Goal status: INITIAL    PLAN: PT FREQUENCY: 2x/week  PT DURATION: 6 weeks  PLANNED INTERVENTIONS: Therapeutic exercises, Therapeutic activity, Neuromuscular re-education, Balance training, Gait training, Patient/Family education, Joint mobilization, Stair training, Orthotic/Fit training, Electrical stimulation, Spinal mobilization, Cryotherapy, Moist heat, Taping, Ultrasound, Ionotophoresis 23m/ml Dexamethasone, Manual therapy, and Re-evaluation.  PLAN FOR NEXT SESSION: Stretching, postural training and strengthening.    Rexann Lueras, SPTA 01/29/22 5:00 PM  01/29/2022, 5:00 PM

## 2022-02-10 ENCOUNTER — Encounter: Payer: Self-pay | Admitting: Physical Therapy

## 2022-02-10 ENCOUNTER — Ambulatory Visit: Payer: Medicare Other | Attending: Family Medicine | Admitting: Physical Therapy

## 2022-02-10 DIAGNOSIS — R29898 Other symptoms and signs involving the musculoskeletal system: Secondary | ICD-10-CM | POA: Insufficient documentation

## 2022-02-10 DIAGNOSIS — M6281 Muscle weakness (generalized): Secondary | ICD-10-CM | POA: Diagnosis present

## 2022-02-10 DIAGNOSIS — M533 Sacrococcygeal disorders, not elsewhere classified: Secondary | ICD-10-CM | POA: Insufficient documentation

## 2022-02-10 NOTE — Therapy (Signed)
OUTPATIENT PHYSICAL THERAPY THORACOLUMBAR   Patient Name: Donna Le MRN: [id number] DOB:1956/03/11, 66 y.o., female Today's Date: 02/10/2022   PT End of Session - 02/10/22 0939     Visit Number 5    Date for PT Re-Evaluation 03/05/22    PT Start Time 0934    PT Stop Time 1012    PT Time Calculation (min) 38 min    Activity Tolerance Patient tolerated treatment well    Behavior During Therapy [facility] for tasks assessed/performed               Past Medical History:  Diagnosis Date   Allergy    Anemia    Dental crowns present    Fibroid    uterus   Heart murmur    Lipoma of neck 08/2012   right   Past Surgical History:  Procedure Laterality Date   CHOLECYSTECTOMY     COLONOSCOPY     LIPOMA EXCISION  08/26/2012   Procedure: EXCISION LIPOMA;  Surgeon: Rozetta Nunnery, MD;  Location: [place];  Service: ENT;  Laterality: Right;   [place]   Patient Active Problem List   Diagnosis Date Noted   Alopecia 07/17/2020   Elevated blood-pressure reading without diagnosis of hypertension 07/17/2020   Epigastric pain 07/17/2020   Hot flashes 07/17/2020   Hypercholesterolemia 07/17/2020   Iron deficiency anemia 07/17/2020   Obesity 07/17/2020   Personal history of colonic polyps 07/17/2020   Prediabetes 07/17/2020   Uterine leiomyoma 07/17/2020   Vitamin D deficiency 07/17/2020   Weight gain 07/17/2020   Family history of breast cancer 06/18/2016   GSI (genuine stress incontinence), female 06/18/2016    PCP: Yaakov Guthrie   REFERRING PROVIDER: Cari Caraway, MD   REFERRING DIAG: S39.012D (ICD-10-CM) - Strain of muscle, fascia and tendon of lower back, subsequent encounter   Rationale for Evaluation and Treatment Rehabilitation  THERAPY DIAG:  Muscle weakness (generalized)  Other symptoms and signs involving the musculoskeletal system  SI (sacroiliac) pain  ONSET DATE: 01/14/2022   SUBJECTIVE:                                                                                                                                                                                            SUBJECTIVE STATEMENT: Patient reports very little pain after performing her exercises, but it goes away quickly.  PERTINENT HISTORY:  HTN, hypercholesterolemia, obesity, pre-DM, hx cholecystectomy  PAIN:  Are you having pain? No 0/10   PRECAUTIONS: None  WEIGHT BEARING RESTRICTIONS No  FALLS:  Has patient fallen in last 6 months? No  LIVING ENVIRONMENT: Lives  with: lives with their spouse and grandson  Lives in: House/apartment Stairs: Yes: Internal: 20 steps; on right going up and External: 2 steps; can reach both Has following equipment at home: None  OCCUPATION: PRN work only in nursing   PLOF: Independent, [place] with basic ADLs, Needs assistance with gait, and Needs assistance with transfers  PATIENT GOALS get back to average daily living without any pain    OBJECTIVE:     SCREENING FOR RED FLAGS: Bowel or bladder incontinence: No Spinal tumors: No Cauda equina syndrome: No Compression fracture: No Abdominal aneurysm: No  COGNITION:  Overall cognitive status: Within functional limits for tasks assessed    OBSERVATIONS  L LE about 1/2 inch longer than the R in supine      MUSCLE LENGTH: B hamstrings moderate limitation B piriformis moderate limitation  POSTURE: rounded shoulders, forward head, decreased lumbar lordosis, increased thoracic kyphosis, and flexed trunk   PALPATION: TTP around L SI joint line, no significant mm spasms or trigger points noted     LUMBAR ROM:   Active  A/PROM  eval  Flexion Moderate limitation   Extension WNL   Right lateral flexion WNL   Left lateral flexion Mild limitation  Right rotation   Left rotation    (Blank rows = not tested)  LOWER EXTREMITY MMT:    MMT Right eval Left eval  Hip flexion 4 3  Hip extension 3- 3  Hip abduction 3 3+  Hip  adduction    Hip internal rotation    Hip external rotation    Knee flexion 5 5  Knee extension 5 5  Ankle dorsiflexion 5 5  Ankle plantarflexion    Ankle inversion    Ankle eversion     (Blank rows = not tested)  LUMBAR SPECIAL TESTS:  Straight leg raise test: Negative     TODAY'S TREATMENT  02/10/22 Recumbent Bicycle L3.5 x 6 minutes. Supine over physioball- bridging x 10, bridge with hip IR x 10, bridge with ball roll x 10, rest break due to foot cramps. BKTC and B KTC at angles R/L, 10 each Supine SI stability-isometric hip abd/add, 5 sec holds, 10 reps Supine and Seated HS stretches Heel raises x 10  01/29/22 NuStep L5 x 6 min Bridging x10 Bridge with hip abduction with red TB x10 Supine marches with red TB x10 B Ball squeeze 2x10 Trunk rotation x10 Isometric TA x10 KTC on ball 2x10 Rows with red TB x10 Rows 10# 2x10 Lat pull down 15# 2x10 Back extension/flexion with black TB 2x10 Passive hamstring/piriformis stretch 2x15"  01/26/22 Leg length re-assessment- equal Supine bridge with hip abd against Red Tband 10 reps. Isometric hip abd/add with 5 sec holds, 10 reps. PPT x 10, required mod VC and TC initially, but then able to complete. Supine march with PPT, 10 reps each. Stretching- piriformis, ITB, seated hamstring stretches for each leg, 3 x 15 sec.  Eval SI MET 6x5 second holds- able to 100% correct leg length discrepancy   Bridges x10 with 2 second hold  Sidelying clams red TB 1x10 B HS stretch 1x30 seconds B    PATIENT EDUCATION:  Education details: reviewed HS and Piriformis stretching Person educated: Patient Education method: Explanation, Demonstration, and Handouts Education comprehension: verbalized understanding and returned demonstration   HOME EXERCISE PROGRAM: [email or web address]  ASSESSMENT:  CLINICAL IMPRESSION: Pt enters today reporting no pain. Progressed trunk stabilization and strengthening, as well as HS stretches and sit to stand with  good form.  OBJECTIVE IMPAIRMENTS Abnormal gait, decreased mobility, difficulty walking, decreased ROM, decreased strength, hypomobility, increased fascial restrictions, impaired flexibility, postural dysfunction, and obesity.   ACTIVITY LIMITATIONS standing, squatting, sleeping, locomotion level, and caring for others  PARTICIPATION LIMITATIONS: cleaning, driving, shopping, community activity, occupation, and yard work  PERSONAL FACTORS Time since onset of injury/illness/exacerbation are also affecting patient's functional outcome.   REHAB POTENTIAL: Good  CLINICAL DECISION MAKING: Stable/uncomplicated  EVALUATION COMPLEXITY: Low   GOALS: Goals reviewed with patient? Yes  SHORT TERM GOALS: Target date: 03/03/2022  Will be compliant with HEP  Baseline: Updated Goal status: met  2.  Will have better understanding of posture and ergonomics  Baseline:  Goal status: ongoing  3.  Hamstring and piriformis flexibility impairment to have improved by 50%  Baseline:  Goal status: ongoing   LONG TERM GOALS: Target date: 03/24/2022  MMT to improve by at least 1 grade in all weak groups  Baseline:  Goal status: INITIAL  2.  Night pain to be no more than 1/10 at worst  Baseline:  Goal status: INITIAL  3.  Will be independent with SI MET techniques PRN  Baseline:  Goal status: INITIAL  4.  Pain with functional task performance will be no more than 1/10 at worst  Baseline:  Goal status: INITIAL    PLAN: PT FREQUENCY: 2x/week  PT DURATION: 6 weeks  PLANNED INTERVENTIONS: Therapeutic exercises, Therapeutic activity, Neuromuscular re-education, Balance training, Gait training, Patient/Family education, Joint mobilization, Stair training, Orthotic/Fit training, Electrical stimulation, Spinal mobilization, Cryotherapy, Moist heat, Taping, Ultrasound, Ionotophoresis 32m/ml Dexamethasone, Manual therapy, and Re-evaluation.  PLAN FOR NEXT SESSION: Stretching, postural training  and strengthening.    SEthel RanaDPT 02/10/22 10:12 AM  02/10/22 10:12 AM  02/10/2022, 10:12 AM

## 2022-02-12 ENCOUNTER — Encounter: Payer: Self-pay | Admitting: Physical Therapy

## 2022-02-12 ENCOUNTER — Ambulatory Visit: Payer: Medicare Other | Admitting: Physical Therapy

## 2022-02-12 DIAGNOSIS — M6281 Muscle weakness (generalized): Secondary | ICD-10-CM

## 2022-02-12 DIAGNOSIS — M533 Sacrococcygeal disorders, not elsewhere classified: Secondary | ICD-10-CM

## 2022-02-12 DIAGNOSIS — R29898 Other symptoms and signs involving the musculoskeletal system: Secondary | ICD-10-CM

## 2022-02-12 NOTE — Therapy (Signed)
OUTPATIENT PHYSICAL THERAPY THORACOLUMBAR   Patient Name: Donna Le MRN: [id number] DOB:1956/06/26, 66 y.o., female Today's Date: 02/12/2022   PT End of Session - 02/12/22 0937     Visit Number 6    Date for PT Re-Evaluation 03/05/22    PT Start Time 0932    PT Stop Time 1011    PT Time Calculation (min) 39 min    Activity Tolerance Patient tolerated treatment well    Behavior During Therapy [facility] for tasks assessed/performed                Past Medical History:  Diagnosis Date   Allergy    Anemia    Dental crowns present    Fibroid    uterus   Heart murmur    Lipoma of neck 08/2012   right   Past Surgical History:  Procedure Laterality Date   CHOLECYSTECTOMY     COLONOSCOPY     LIPOMA EXCISION  08/26/2012   Procedure: EXCISION LIPOMA;  Surgeon: Rozetta Nunnery, MD;  Location: [place];  Service: ENT;  Laterality: Right;   [place]   Patient Active Problem List   Diagnosis Date Noted   Alopecia 07/17/2020   Elevated blood-pressure reading without diagnosis of hypertension 07/17/2020   Epigastric pain 07/17/2020   Hot flashes 07/17/2020   Hypercholesterolemia 07/17/2020   Iron deficiency anemia 07/17/2020   Obesity 07/17/2020   Personal history of colonic polyps 07/17/2020   Prediabetes 07/17/2020   Uterine leiomyoma 07/17/2020   Vitamin D deficiency 07/17/2020   Weight gain 07/17/2020   Family history of breast cancer 06/18/2016   GSI (genuine stress incontinence), female 06/18/2016    PCP: Yaakov Guthrie   REFERRING PROVIDER: Cari Caraway, MD   REFERRING DIAG: S39.012D (ICD-10-CM) - Strain of muscle, fascia and tendon of lower back, subsequent encounter   Rationale for Evaluation and Treatment Rehabilitation  THERAPY DIAG:  Muscle weakness (generalized)  Other symptoms and signs involving the musculoskeletal system  SI (sacroiliac) pain  ONSET DATE: 01/14/2022   SUBJECTIVE:                                                                                                                                                                                            SUBJECTIVE STATEMENT: Patient reports no new issues.  PERTINENT HISTORY:  HTN, hypercholesterolemia, obesity, pre-DM, hx cholecystectomy  PAIN:  Are you having pain? No 0/10   PRECAUTIONS: None  WEIGHT BEARING RESTRICTIONS No  FALLS:  Has patient fallen in last 6 months? 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   PLOF: Independent, [place] with basic ADLs, Needs assistance with gait, and Needs assistance with transfers  PATIENT GOALS get back to average daily living without any pain    OBJECTIVE:     SCREENING FOR RED FLAGS: Bowel or bladder incontinence: No Spinal tumors: No Cauda equina syndrome: No Compression fracture: No Abdominal aneurysm: No  COGNITION:  Overall cognitive status: Within functional limits for tasks assessed    OBSERVATIONS  L LE about 1/2 inch longer than the R in supine      MUSCLE LENGTH: B hamstrings moderate limitation B piriformis moderate limitation  POSTURE: rounded shoulders, forward head, decreased lumbar lordosis, increased thoracic kyphosis, and flexed trunk   PALPATION: TTP around L SI joint line, no significant mm spasms or trigger points noted     LUMBAR ROM:   Active  A/PROM  eval  Flexion Moderate limitation   Extension WNL   Right lateral flexion WNL   Left lateral flexion Mild limitation  Right rotation   Left rotation    (Blank rows = not tested)  LOWER EXTREMITY MMT:    MMT Right eval Left eval  Hip flexion 4 3  Hip extension 3- 3  Hip abduction 3 3+  Hip adduction    Hip internal rotation    Hip external  rotation    Knee flexion 5 5  Knee extension 5 5  Ankle dorsiflexion 5 5  Ankle plantarflexion    Ankle inversion    Ankle eversion     (Blank rows = not tested)  LUMBAR SPECIAL TESTS:  Straight leg raise test: Negative     TODAY'S TREATMENT  02/12/22 Supine PPT 10 reps, 5 sec holds Bridge with Red Tband resistance at knees, 2 x 10 reps Sit to stand with OHP, 3# weight in BUE 10 reps. Rows, Sh Ext, ER with Gr Tband resistance, 2 x 10 reps each B side Step against Green Tband resistance. Supine stretch using strap- HS, Abd, Add, 3 x10 sec  02/10/22 Recumbent Bicycle L3.5 x 6 minutes. Supine over physioball- bridging x 10, bridge with hip IR x 10, bridge with ball roll x 10, rest break due to foot cramps. BKTC and B KTC at angles R/L, 10 each Supine SI stability-isometric hip abd/add, 5 sec holds, 10 reps Supine and Seated HS stretches Heel raises x 10  01/29/22 NuStep L5 x 6 min Bridging x10 Bridge with hip abduction with red TB x10 Supine marches with red TB x10 B Ball squeeze 2x10 Trunk rotation x10 Isometric TA x10 KTC on ball 2x10 Rows with red TB x10 Rows 10# 2x10 Lat pull down 15# 2x10 Back extension/flexion with black TB 2x10 Passive hamstring/piriformis stretch 2x15"  01/26/22 Leg length re-assessment- equal Supine bridge with hip abd against Red Tband 10 reps. Isometric hip abd/add with 5 sec holds, 10 reps. PPT x 10, required mod VC and TC initially, but then able to complete. Supine march with PPT, 10 reps each. Stretching- piriformis, ITB, seated hamstring stretches for each leg, 3 x 15 sec.  Eval SI MET 6x5 second holds- able to 100% correct leg length discrepancy   Bridges x10 with 2 second hold  Sidelying clams red TB 1x10 B HS stretch 1x30 seconds B    PATIENT EDUCATION:  Education details: reviewed HS and Piriformis stretching  Person educated: Patient Education method: Explanation, Demonstration, and Handouts Education comprehension:  verbalized understanding and returned demonstration   HOME EXERCISE PROGRAM: [id number]  ASSESSMENT:  CLINICAL IMPRESSION: Pt enters today reporting no pain.Updated HEP to include additional trunk stabilization    OBJECTIVE IMPAIRMENTS Abnormal gait, decreased mobility, difficulty walking, decreased ROM, decreased strength, hypomobility, increased fascial restrictions, impaired flexibility, postural dysfunction, and obesity.   ACTIVITY LIMITATIONS standing, squatting, sleeping, locomotion level, and caring for others  PARTICIPATION LIMITATIONS: cleaning, driving, shopping, community activity, occupation, and yard work  PERSONAL FACTORS Time since onset of injury/illness/exacerbation are also affecting patient's functional outcome.   REHAB POTENTIAL: Good  CLINICAL DECISION MAKING: Stable/uncomplicated  EVALUATION COMPLEXITY: Low   GOALS: Goals reviewed with patient? Yes  SHORT TERM GOALS: Target date: 02/24/2022  Will be compliant with HEP  Baseline: Updated Goal status: met  2.  Will have better understanding of posture and ergonomics  Baseline:  Goal status: ongoing  3.  Hamstring and piriformis flexibility impairment to have improved by 50%  Baseline:  Goal status: ongoing   LONG TERM GOALS: Target date: 03/05/2022  MMT to improve by at least 1 grade in all weak groups  Baseline:  Goal status: INITIAL  2.  Night pain to be no more than 1/10 at worst  Baseline:  Goal status: INITIAL  3.  Will be independent with SI MET techniques PRN  Baseline:  Goal status: INITIAL  4.  Pain with functional task performance will be no more than 1/10 at worst  Baseline:  Goal status: INITIAL    PLAN: PT FREQUENCY: 2x/week  PT DURATION: 6 weeks  PLANNED INTERVENTIONS: Therapeutic exercises, Therapeutic activity, Neuromuscular re-education, Balance training, Gait training, Patient/Family education, Joint mobilization, Stair training, Orthotic/Fit training, Electrical  stimulation, Spinal mobilization, Cryotherapy, Moist heat, Taping, Ultrasound, Ionotophoresis 65m/ml Dexamethasone, Manual therapy, and Re-evaluation.  PLAN FOR NEXT SESSION: Stretching, postural training and strengthening.    SEthel RanaDPT 02/12/22 10:14 AM  02/12/22 10:14 AM  02/12/2022, 10:14 AM

## 2022-02-17 ENCOUNTER — Encounter: Payer: Self-pay | Admitting: Physical Therapy

## 2022-02-17 ENCOUNTER — Ambulatory Visit: Payer: Medicare Other | Admitting: Physical Therapy

## 2022-02-17 DIAGNOSIS — M6281 Muscle weakness (generalized): Secondary | ICD-10-CM

## 2022-02-17 DIAGNOSIS — M533 Sacrococcygeal disorders, not elsewhere classified: Secondary | ICD-10-CM

## 2022-02-17 DIAGNOSIS — R29898 Other symptoms and signs involving the musculoskeletal system: Secondary | ICD-10-CM

## 2022-02-17 NOTE — Therapy (Signed)
OUTPATIENT PHYSICAL THERAPY THORACOLUMBAR   Patient Name: Donna Le MRN: [id number] DOB:[birth year], 66 y.o., female Today's Date: 02/17/2022   PT End of Session - 02/17/22 1557     Visit Number 7    Date for PT Re-Evaluation 03/05/22    PT Start Time 1600    PT Stop Time 1645    PT Time Calculation (min) 45 min    Activity Tolerance Patient tolerated treatment well    Behavior During Therapy [facility] for tasks assessed/performed                Past Medical History:  Diagnosis Date   Allergy    Anemia    Dental crowns present    Fibroid    uterus   Heart murmur    Lipoma of neck 08/2012   right   Past Surgical History:  Procedure Laterality Date   CHOLECYSTECTOMY     COLONOSCOPY     LIPOMA EXCISION  08/26/2012   Procedure: EXCISION LIPOMA;  Surgeon: Rozetta Nunnery, MD;  Location: [place];  Service: ENT;  Laterality: Right;   [place]   Patient Active Problem List   Diagnosis Date Noted   Alopecia 07/17/2020   Elevated blood-pressure reading without diagnosis of hypertension 07/17/2020   Epigastric pain 07/17/2020   Hot flashes 07/17/2020   Hypercholesterolemia 07/17/2020   Iron deficiency anemia 07/17/2020   Obesity 07/17/2020   Personal history of colonic polyps 07/17/2020   Prediabetes 07/17/2020   Uterine leiomyoma 07/17/2020   Vitamin D deficiency 07/17/2020   Weight gain 07/17/2020   Family history of breast cancer 06/18/2016   GSI (genuine stress incontinence), female 06/18/2016    PCP: Yaakov Guthrie   REFERRING PROVIDER: Cari Caraway, MD   REFERRING DIAG: S39.012D (ICD-10-CM) - Strain of muscle, fascia and tendon of lower back, subsequent encounter   Rationale for Evaluation and Treatment Rehabilitation  THERAPY DIAG:  Muscle weakness (generalized)  Other symptoms and signs involving the musculoskeletal system  SI (sacroiliac) pain  ONSET DATE: 01/14/2022   SUBJECTIVE:                                                                                                                                                                                            SUBJECTIVE STATEMENT: "I am good, feeling better"  PERTINENT HISTORY:  HTN, hypercholesterolemia, obesity, pre-DM, hx cholecystectomy  PAIN:  Are you having pain? No 0/10   PRECAUTIONS: None  WEIGHT BEARING RESTRICTIONS No  FALLS:  Has patient fallen in last 6 months? 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   PLOF: Independent, [place] with basic ADLs, Needs assistance with gait, and Needs assistance with transfers  PATIENT GOALS get back to average daily living without any pain    OBJECTIVE:    OBSERVATIONS  L LE about 1/2 inch longer than the R in supine      MUSCLE LENGTH: B hamstrings moderate limitation B piriformis moderate limitation  POSTURE: rounded shoulders, forward head, decreased lumbar lordosis, increased thoracic kyphosis, and flexed trunk   PALPATION: TTP around L SI joint line, no significant mm spasms or trigger points noted     LUMBAR ROM:   Active  A/PROM  eval  Flexion Moderate limitation   Extension WNL   Right lateral flexion WNL   Left lateral flexion Mild limitation  Right rotation   Left rotation    (Blank rows = not tested)  LOWER EXTREMITY MMT:    MMT Right eval Left eval  Hip flexion 4 3  Hip extension 3- 3  Hip abduction 3 3+  Hip adduction    Hip internal rotation    Hip external rotation    Knee flexion 5 5  Knee extension 5 5  Ankle dorsiflexion 5 5  Ankle plantarflexion    Ankle inversion    Ankle eversion     (Blank rows = not tested)  LUMBAR SPECIAL TESTS:  Straight leg raise test: Negative     TODAY'S TREATMENT   02/17/22 NuStep L4 x 6 min  30lb resisted gait 4 way x 3 each  6in step ups x10 each Lateral step ups 2x10  S2S holding yellow ball 2x10  Ball squeezes 2x10  Supine bridges 2x10  Hooklying supine hip abd   02/12/22 Supine PPT 10 reps, 5 sec holds Bridge with Red Tband resistance at knees, 2 x 10 reps Sit to stand with OHP, 3# weight in BUE 10 reps. Rows, Sh Ext, ER with Gr Tband resistance, 2 x 10 reps each B side Step against Green Tband resistance. Supine stretch using strap- HS, Abd, Add, 3 x10 sec  02/10/22 Recumbent Bicycle L3.5 x 6 minutes. Supine over physioball- bridging x 10, bridge with hip IR x 10, bridge with ball roll x 10, rest break due to foot cramps. BKTC and B KTC at angles R/L, 10 each Supine SI stability-isometric hip abd/add, 5 sec holds, 10 reps Supine and Seated HS stretches Heel raises x 10  01/29/22 NuStep L5 x 6 min Bridging x10 Bridge with hip abduction with red TB x10 Supine marches with red TB x10 B Ball squeeze 2x10 Trunk rotation x10 Isometric TA x10 KTC on ball 2x10 Rows with red TB x10 Rows 10# 2x10 Lat pull down 15# 2x10 Back extension/flexion with black TB 2x10 Passive hamstring/piriformis stretch 2x15"  01/26/22 Leg length re-assessment- equal Supine bridge with hip abd against Red Tband 10 reps. Isometric hip abd/add with 5 sec holds, 10 reps. PPT x 10, required mod VC and TC initially, but then able to complete. Supine march with PPT, 10 reps each. Stretching- piriformis, ITB, seated hamstring stretches for each leg, 3 x 15 sec.  Eval SI MET 6x5 second holds- able to 100% correct leg length discrepancy   Bridges x10 with 2 second hold  Sidelying clams red TB 1x10 B HS stretch 1x30 seconds B    PATIENT EDUCATION:  Education details: reviewed HS and Piriformis stretching Person educated: Patient Education method: Explanation, Demonstration, and Handouts Education comprehension: verbalized understanding and returned  demonstration   HOME EXERCISE PROGRAM: [email or web address]  ASSESSMENT:  CLINICAL IMPRESSION: Pt enters today reporting no pain and improvement overall. Session consisted of more functional strength. Some hip instability present with step ups. Good control with resisted gait. Cues for glute activation needed with sit to stands.    OBJECTIVE IMPAIRMENTS Abnormal gait, decreased mobility, difficulty walking, decreased ROM, decreased strength, hypomobility, increased fascial restrictions, impaired flexibility, postural dysfunction, and obesity.   ACTIVITY LIMITATIONS standing, squatting, sleeping, locomotion level, and caring for others  PARTICIPATION LIMITATIONS: cleaning, driving, shopping, community activity, occupation, and yard work  PERSONAL FACTORS Time since onset of injury/illness/exacerbation are also affecting patient's functional outcome.   REHAB POTENTIAL: Good  CLINICAL DECISION MAKING: Stable/uncomplicated  EVALUATION COMPLEXITY: Low   GOALS: Goals reviewed with patient? Yes  SHORT TERM GOALS: Target date: 02/24/2022  Will be compliant with HEP  Baseline: Updated Goal status: met  2.  Will have better understanding of posture and ergonomics  Baseline:  Goal status: ongoing  3.  Hamstring and piriformis flexibility impairment to have improved by 50%  Baseline:  Goal status: ongoing   LONG TERM GOALS: Target date: 03/05/2022  MMT to improve by at least 1 grade in all weak groups  Baseline:  Goal status: INITIAL  2.  Night pain to be no more than 1/10 at worst  Baseline:  Goal status: INITIAL  3.  Will be independent with SI MET techniques PRN  Baseline:  Goal status: INITIAL  4.  Pain with functional task performance will be no more than 1/10 at worst  Baseline:  Goal status: INITIAL    PLAN: PT FREQUENCY: 2x/week  PT DURATION: 6 weeks  PLANNED INTERVENTIONS: Therapeutic exercises, Therapeutic activity, Neuromuscular re-education, Balance training,  Gait training, Patient/Family education, Joint mobilization, Stair training, Orthotic/Fit training, Electrical stimulation, Spinal mobilization, Cryotherapy, Moist heat, Taping, Ultrasound, Ionotophoresis 36m/ml Dexamethasone, Manual therapy, and Re-evaluation.  PLAN FOR NEXT SESSION: Stretching, postural training and strengthening.    SEthel RanaDPT 02/17/22 3:58 PM  02/17/22 3:58 PM  02/17/2022, 3:58 PM

## 2022-02-19 ENCOUNTER — Ambulatory Visit: Payer: Medicare Other

## 2022-02-19 DIAGNOSIS — M533 Sacrococcygeal disorders, not elsewhere classified: Secondary | ICD-10-CM

## 2022-02-19 DIAGNOSIS — M6281 Muscle weakness (generalized): Secondary | ICD-10-CM | POA: Diagnosis not present

## 2022-02-19 DIAGNOSIS — R29898 Other symptoms and signs involving the musculoskeletal system: Secondary | ICD-10-CM

## 2022-02-19 NOTE — Therapy (Signed)
OUTPATIENT PHYSICAL THERAPY THORACOLUMBAR   Patient Name: Donna Le MRN: [id number] DOB:Aug 26, 1955, 66 y.o., female Today's Date: 02/19/2022   PT End of Session - 02/19/22 1546     Visit Number 8    Date for PT Re-Evaluation 03/05/22    PT Start Time 1545    PT Stop Time 1630    PT Time Calculation (min) 45 min    Activity Tolerance Patient tolerated treatment well    Behavior During Therapy [facility] for tasks assessed/performed                 Past Medical History:  Diagnosis Date   Allergy    Anemia    Dental crowns present    Fibroid    uterus   Heart murmur    Lipoma of neck 08/2012   right   Past Surgical History:  Procedure Laterality Date   CHOLECYSTECTOMY     COLONOSCOPY     LIPOMA EXCISION  08/26/2012   Procedure: EXCISION LIPOMA;  Surgeon: Rozetta Nunnery, MD;  Location: [place];  Service: ENT;  Laterality: Right;   [place]   Patient Active Problem List   Diagnosis Date Noted   Alopecia 07/17/2020   Elevated blood-pressure reading without diagnosis of hypertension 07/17/2020   Epigastric pain 07/17/2020   Hot flashes 07/17/2020   Hypercholesterolemia 07/17/2020   Iron deficiency anemia 07/17/2020   Obesity 07/17/2020   Personal history of colonic polyps 07/17/2020   Prediabetes 07/17/2020   Uterine leiomyoma 07/17/2020   Vitamin D deficiency 07/17/2020   Weight gain 07/17/2020   Family history of breast cancer 06/18/2016   GSI (genuine stress incontinence), female 06/18/2016    PCP: Yaakov Guthrie   REFERRING PROVIDER: Cari Caraway, MD   REFERRING DIAG: S39.012D (ICD-10-CM) - Strain of muscle, fascia and tendon of lower back, subsequent encounter   Rationale for Evaluation and Treatment Rehabilitation  THERAPY DIAG:  Muscle weakness (generalized)  Other symptoms and signs involving the musculoskeletal system  SI (sacroiliac) pain  ONSET DATE: 01/14/2022   SUBJECTIVE:                                                                                                                                                                                            SUBJECTIVE STATEMENT: Doing good, feeling much better.   PERTINENT HISTORY:  HTN, hypercholesterolemia, obesity, pre-DM, hx cholecystectomy  PAIN:  Are you having pain? No 0/10   PRECAUTIONS: None  WEIGHT BEARING RESTRICTIONS No  FALLS:  Has patient fallen in last 6 months? No  LIVING ENVIRONMENT: Lives with: lives with their spouse and  grandson  Lives in: House/apartment Stairs: Yes: Internal: 20 steps; on right going up and External: 2 steps; can reach both Has following equipment at home: None  OCCUPATION: PRN work only in nursing   PLOF: Independent, [place] with basic ADLs, Needs assistance with gait, and Needs assistance with transfers  PATIENT GOALS get back to average daily living without any pain    OBJECTIVE:    OBSERVATIONS  L LE about 1/2 inch longer than the R in supine      MUSCLE LENGTH: B hamstrings moderate limitation B piriformis moderate limitation  POSTURE: rounded shoulders, forward head, decreased lumbar lordosis, increased thoracic kyphosis, and flexed trunk   PALPATION: TTP around L SI joint line, no significant mm spasms or trigger points noted     LUMBAR ROM:   Active  A/PROM  eval  Flexion Moderate limitation   Extension WNL   Right lateral flexion WNL   Left lateral flexion Mild limitation  Right rotation   Left rotation    (Blank rows = not tested)  LOWER EXTREMITY MMT:    MMT Right eval Left eval  Hip flexion 4 3  Hip extension 3- 3  Hip abduction 3 3+  Hip adduction    Hip internal rotation    Hip external rotation    Knee flexion 5 5  Knee extension 5 5  Ankle dorsiflexion 5 5  Ankle plantarflexion    Ankle inversion    Ankle eversion     (Blank rows = not tested)  LUMBAR SPECIAL TESTS:  Straight leg raise test: Negative     TODAY'S  TREATMENT  02/19/22 Nustep L4 x16mns  STS w/OHP 2x10 Passive stretching SK2C, HS, pirfirormis bilaterally 30s each Bridges w/ball squeeze 2x10 Lateral steps against green band  Cable pull through 10# 2x10 Hip abd/ext 5# 2x10   02/17/22 NuStep L4 x 6 min  30lb resisted gait 4 way x 3 each  6in step ups x10 each Lateral step ups 2x10  S2S holding yellow ball 2x10  Ball squeezes 2x10  Supine bridges 2x10  Hooklying supine hip abd   02/12/22 Supine PPT 10 reps, 5 sec holds Bridge with Red Tband resistance at knees, 2 x 10 reps Sit to stand with OHP, 3# weight in BUE 10 reps. Rows, Sh Ext, ER with Gr Tband resistance, 2 x 10 reps each B side Step against Green Tband resistance. Supine stretch using strap- HS, Abd, Add, 3 x10 sec  02/10/22 Recumbent Bicycle L3.5 x 6 minutes. Supine over physioball- bridging x 10, bridge with hip IR x 10, bridge with ball roll x 10, rest break due to foot cramps. BKTC and B KTC at angles R/L, 10 each Supine SI stability-isometric hip abd/add, 5 sec holds, 10 reps Supine and Seated HS stretches Heel raises x 10  01/29/22 NuStep L5 x 6 min Bridging x10 Bridge with hip abduction with red TB x10 Supine marches with red TB x10 B Ball squeeze 2x10 Trunk rotation x10 Isometric TA x10 KTC on ball 2x10 Rows with red TB x10 Rows 10# 2x10 Lat pull down 15# 2x10 Back extension/flexion with black TB 2x10 Passive hamstring/piriformis stretch 2x15"  01/26/22 Leg length re-assessment- equal Supine bridge with hip abd against Red Tband 10 reps. Isometric hip abd/add with 5 sec holds, 10 reps. PPT x 10, required mod VC and TC initially, but then able to complete. Supine march with PPT, 10 reps each. Stretching- piriformis, ITB, seated hamstring stretches for each leg, 3 x 15 sec.  Eval SI MET 6x5 second holds- able to 100% correct leg length discrepancy   Bridges x10 with 2 second hold  Sidelying clams red TB 1x10 B HS stretch 1x30 seconds B     PATIENT EDUCATION:  Education details: reviewed HS and Piriformis stretching Person educated: Patient Education method: Explanation, Demonstration, and Handouts Education comprehension: verbalized understanding and returned demonstration   HOME EXERCISE PROGRAM: [id number]  ASSESSMENT:  CLINICAL IMPRESSION: Pt continues to report no pain and is very pleased with her improvement overall. We focused on more functional strengthening for hips and core/back. Pt presents with increased tightness bilaterally, we did some passive stretching. Requires verbal and tactile cues for cable pull thorough and hip cable exercises for proper form. =Able to complete all activities well.    OBJECTIVE IMPAIRMENTS Abnormal gait, decreased mobility, difficulty walking, decreased ROM, decreased strength, hypomobility, increased fascial restrictions, impaired flexibility, postural dysfunction, and obesity.   ACTIVITY LIMITATIONS standing, squatting, sleeping, locomotion level, and caring for others  PARTICIPATION LIMITATIONS: cleaning, driving, shopping, community activity, occupation, and yard work  PERSONAL FACTORS Time since onset of injury/illness/exacerbation are also affecting patient's functional outcome.   REHAB POTENTIAL: Good  CLINICAL DECISION MAKING: Stable/uncomplicated  EVALUATION COMPLEXITY: Low   GOALS: Goals reviewed with patient? Yes  SHORT TERM GOALS: Target date: 02/24/2022  Will be compliant with HEP  Baseline: Updated Goal status: met  2.  Will have better understanding of posture and ergonomics  Baseline:  Goal status: ongoing  3.  Hamstring and piriformis flexibility impairment to have improved by 50%  Baseline:  Goal status: ongoing   LONG TERM GOALS: Target date: 03/05/2022  MMT to improve by at least 1 grade in all weak groups  Baseline:  Goal status: INITIAL  2.  Night pain to be no more than 1/10 at worst  Baseline:  Goal status: INITIAL  3.  Will be  independent with SI MET techniques PRN  Baseline:  Goal status: INITIAL  4.  Pain with functional task performance will be no more than 1/10 at worst  Baseline:  Goal status: INITIAL    PLAN: PT FREQUENCY: 2x/week  PT DURATION: 6 weeks  PLANNED INTERVENTIONS: Therapeutic exercises, Therapeutic activity, Neuromuscular re-education, Balance training, Gait training, Patient/Family education, Joint mobilization, Stair training, Orthotic/Fit training, Electrical stimulation, Spinal mobilization, Cryotherapy, Moist heat, Taping, Ultrasound, Ionotophoresis 47m/ml Dexamethasone, Manual therapy, and Re-evaluation.  PLAN FOR NEXT SESSION: Stretching, postural training and strengthening.   MAndris Baumann DPT  02/19/2022, 4:31 PM

## 2022-02-25 NOTE — Therapy (Signed)
OUTPATIENT PHYSICAL THERAPY THORACOLUMBAR   Patient Name: Donna Le MRN: [id number] DOB:09/19/1955, 66 y.o., female Today's Date: 02/26/2022   PT End of Session - 02/26/22 0928     Visit Number 9    Date for PT Re-Evaluation 03/05/22    PT Start Time 0930    PT Stop Time [id number]    PT Time Calculation (min) 45 min    Activity Tolerance Patient tolerated treatment well    Behavior During Therapy [facility] for tasks assessed/performed                  Past Medical History:  Diagnosis Date   Allergy    Anemia    Dental crowns present    Fibroid    uterus   Heart murmur    Lipoma of neck 08/2012   right   Past Surgical History:  Procedure Laterality Date   CHOLECYSTECTOMY     COLONOSCOPY     LIPOMA EXCISION  08/26/2012   Procedure: EXCISION LIPOMA;  Surgeon: Rozetta Nunnery, MD;  Location: [place];  Service: ENT;  Laterality: Right;   [place]   Patient Active Problem List   Diagnosis Date Noted   Alopecia 07/17/2020   Elevated blood-pressure reading without diagnosis of hypertension 07/17/2020   Epigastric pain 07/17/2020   Hot flashes 07/17/2020   Hypercholesterolemia 07/17/2020   Iron deficiency anemia 07/17/2020   Obesity 07/17/2020   Personal history of colonic polyps 07/17/2020   Prediabetes 07/17/2020   Uterine leiomyoma 07/17/2020   Vitamin D deficiency 07/17/2020   Weight gain 07/17/2020   Family history of breast cancer 06/18/2016   GSI (genuine stress incontinence), female 06/18/2016    PCP: Yaakov Guthrie   REFERRING PROVIDER: Cari Caraway, MD   REFERRING DIAG: S39.012D (ICD-10-CM) - Strain of muscle, fascia and tendon of lower back, subsequent encounter   Rationale for Evaluation and Treatment Rehabilitation  THERAPY DIAG:  Muscle weakness (generalized)  Other symptoms and signs involving the musculoskeletal system  SI (sacroiliac) pain  ONSET DATE: 01/14/2022   SUBJECTIVE:                                                                                                                                                                                            SUBJECTIVE STATEMENT: Doing good, feeling much better.   PERTINENT HISTORY:  HTN, hypercholesterolemia, obesity, pre-DM, hx cholecystectomy  PAIN:  Are you having pain? No 0/10   PRECAUTIONS: None  WEIGHT BEARING RESTRICTIONS No  FALLS:  Has patient fallen in last 6 months? No  LIVING ENVIRONMENT: Lives with: lives with their spouse  and grandson  Lives in: House/apartment Stairs: Yes: Internal: 20 steps; on right going up and External: 2 steps; can reach both Has following equipment at home: None  OCCUPATION: PRN work only in nursing   PLOF: Independent, [place] with basic ADLs, Needs assistance with gait, and Needs assistance with transfers  PATIENT GOALS get back to average daily living without any pain    OBJECTIVE:    OBSERVATIONS  L LE about 1/2 inch longer than the R in supine      MUSCLE LENGTH: B hamstrings moderate limitation B piriformis moderate limitation  POSTURE: rounded shoulders, forward head, decreased lumbar lordosis, increased thoracic kyphosis, and flexed trunk   PALPATION: TTP around L SI joint line, no significant mm spasms or trigger points noted     LUMBAR ROM:   Active  A/PROM  eval  Flexion Moderate limitation   Extension WNL   Right lateral flexion WNL   Left lateral flexion Mild limitation  Right rotation   Left rotation    (Blank rows = not tested)  LOWER EXTREMITY MMT:  02/26/22 [name] 4/5 for all hip muscles   MMT Right eval Left eval  Hip flexion 4 3  Hip extension 3- 3  Hip abduction 3 3+  Hip adduction    Hip internal rotation    Hip external rotation    Knee flexion 5 5  Knee extension 5 5  Ankle dorsiflexion 5 5  Ankle plantarflexion    Ankle inversion    Ankle eversion     (Blank rows = not tested)  LUMBAR SPECIAL TESTS:  Straight  leg raise test: Negative     TODAY'S TREATMENT  02/25/22 Review goals and HEP for D/C  Nustep L5 x33mns  Stretching HS, piriformis, SK2C 30s Hip hikes 2x10 bilat  Gastroc stretch on slant 158m Resisted gait 30# 4 way x4 Leg press 20# x10, 40# x10 KB swings 5# 2x10   02/19/22 Nustep L4 x6m19m  STS w/OHP 2x10 Passive stretching SK2C, HS, pirfirormis bilaterally 30s each Bridges w/ball squeeze 2x10 Lateral steps against green band  Cable pull through 10# 2x10 Hip abd/ext 5# 2x10   02/17/22 NuStep L4 x 6 min  30lb resisted gait 4 way x 3 each  6in step ups x10 each Lateral step ups 2x10  S2S holding yellow ball 2x10  Ball squeezes 2x10  Supine bridges 2x10  Hooklying supine hip abd   02/12/22 Supine PPT 10 reps, 5 sec holds Bridge with Red Tband resistance at knees, 2 x 10 reps Sit to stand with OHP, 3# weight in BUE 10 reps. Rows, Sh Ext, ER with Gr Tband resistance, 2 x 10 reps each B side Step against Green Tband resistance. Supine stretch using strap- HS, Abd, Add, 3 x10 sec  02/10/22 Recumbent Bicycle L3.5 x 6 minutes. Supine over physioball- bridging x 10, bridge with hip IR x 10, bridge with ball roll x 10, rest break due to foot cramps. BKTC and B KTC at angles R/L, 10 each Supine SI stability-isometric hip abd/add, 5 sec holds, 10 reps Supine and Seated HS stretches Heel raises x 10  01/29/22 NuStep L5 x 6 min Bridging x10 Bridge with hip abduction with red TB x10 Supine marches with red TB x10 B Ball squeeze 2x10 Trunk rotation x10 Isometric TA x10 KTC on ball 2x10 Rows with red TB x10 Rows 10# 2x10 Lat pull down 15# 2x10 Back extension/flexion with black TB 2x10 Passive hamstring/piriformis stretch 2x15"  01/26/22 Leg length re-assessment- equal  Supine bridge with hip abd against Red Tband 10 reps. Isometric hip abd/add with 5 sec holds, 10 reps. PPT x 10, required mod VC and TC initially, but then able to complete. Supine march with PPT, 10  reps each. Stretching- piriformis, ITB, seated hamstring stretches for each leg, 3 x 15 sec.  Eval SI MET 6x5 second holds- able to 100% correct leg length discrepancy   Bridges x10 with 2 second hold  Sidelying clams red TB 1x10 B HS stretch 1x30 seconds B    PATIENT EDUCATION:  Education details: reviewed HS and Piriformis stretching Person educated: Patient Education method: Explanation, Demonstration, and Handouts Education comprehension: verbalized understanding and returned demonstration   HOME EXERCISE PROGRAM: [email or web address]  ASSESSMENT:  CLINICAL IMPRESSION: Patient is dong well overall, she has no more visits scheduled but she feels good about being discharged. We reviewed her goals and HEP. Continued with hip strengthening and stretches today and educated about continuing these at home and at a gym if possible. She has done very well with therapy overall and is pleased with her progress.   OBJECTIVE IMPAIRMENTS Abnormal gait, decreased mobility, difficulty walking, decreased ROM, decreased strength, hypomobility, increased fascial restrictions, impaired flexibility, postural dysfunction, and obesity.   ACTIVITY LIMITATIONS standing, squatting, sleeping, locomotion level, and caring for others  PARTICIPATION LIMITATIONS: cleaning, driving, shopping, community activity, occupation, and yard work  PERSONAL FACTORS Time since onset of injury/illness/exacerbation are also affecting patient's functional outcome.   REHAB POTENTIAL: Good  CLINICAL DECISION MAKING: Stable/uncomplicated  EVALUATION COMPLEXITY: Low   GOALS: Goals reviewed with patient? Yes  SHORT TERM GOALS: Target date: 02/24/2022  Will be compliant with HEP  Baseline: Updated Goal status: met  2.  Will have better understanding of posture and ergonomics  Baseline:  Goal status: met  3.  Hamstring and piriformis flexibility impairment to have improved by 50%  Baseline:  Goal status:  ongoing   LONG TERM GOALS: Target date: 03/05/2022  MMT to improve by at least 1 grade in all weak groups  Baseline: 02/26/22- 4/5  Goal status: MET  2.  Night pain to be no more than 1/10 at worst  Baseline:  Goal status: MET  3.  Will be independent with SI MET techniques PRN  Baseline:  Goal status: MET  4.  Pain with functional task performance will be no more than 1/10 at worst  Baseline:  Goal status: MET    PLAN: PT FREQUENCY: 2x/week  PT DURATION: 6 weeks  PLANNED INTERVENTIONS: Therapeutic exercises, Therapeutic activity, Neuromuscular re-education, Balance training, Gait training, Patient/Family education, Joint mobilization, Stair training, Orthotic/Fit training, Electrical stimulation, Spinal mobilization, Cryotherapy, Moist heat, Taping, Ultrasound, Ionotophoresis 57m/ml Dexamethasone, Manual therapy, and Re-evaluation.  PLAN FOR NEXT SESSION: d/c PHYSICAL THERAPY DISCHARGE SUMMARY  Visits from Start of Care: 9   Patient agrees to discharge. Patient goals were met. Patient is being discharged due to meeting the stated rehab goals.   MAndris Baumann DPT  02/26/2022, 10:18 AM

## 2022-02-26 ENCOUNTER — Ambulatory Visit: Payer: Medicare Other

## 2022-02-26 DIAGNOSIS — M6281 Muscle weakness (generalized): Secondary | ICD-10-CM | POA: Diagnosis not present

## 2022-02-26 DIAGNOSIS — M533 Sacrococcygeal disorders, not elsewhere classified: Secondary | ICD-10-CM

## 2022-02-26 DIAGNOSIS — R29898 Other symptoms and signs involving the musculoskeletal system: Secondary | ICD-10-CM

## 2022-03-18 IMAGING — MG MM DIGITAL SCREENING BILAT W/ TOMO AND CAD
8 series · 8 of 24 positions shown · non-contrast
Comparison: Previous exam(s).

CLINICAL DATA: Screening.

EXAM:
DIGITAL SCREENING BILATERAL MAMMOGRAM WITH TOMOSYNTHESIS AND CAD
TECHNIQUE: Bilateral screening digital craniocaudal and mediolateral oblique
mammograms were obtained. Bilateral screening digital breast
tomosynthesis was performed. The images were evaluated with
computer-aided detection.

[L MLO synth-2D]
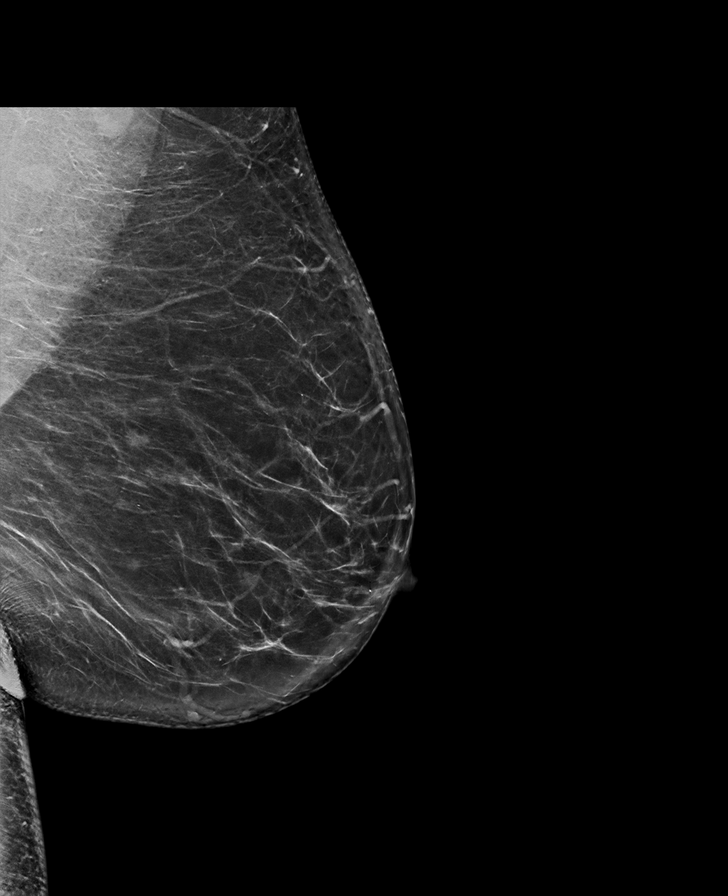

[R MLO synth-2D]
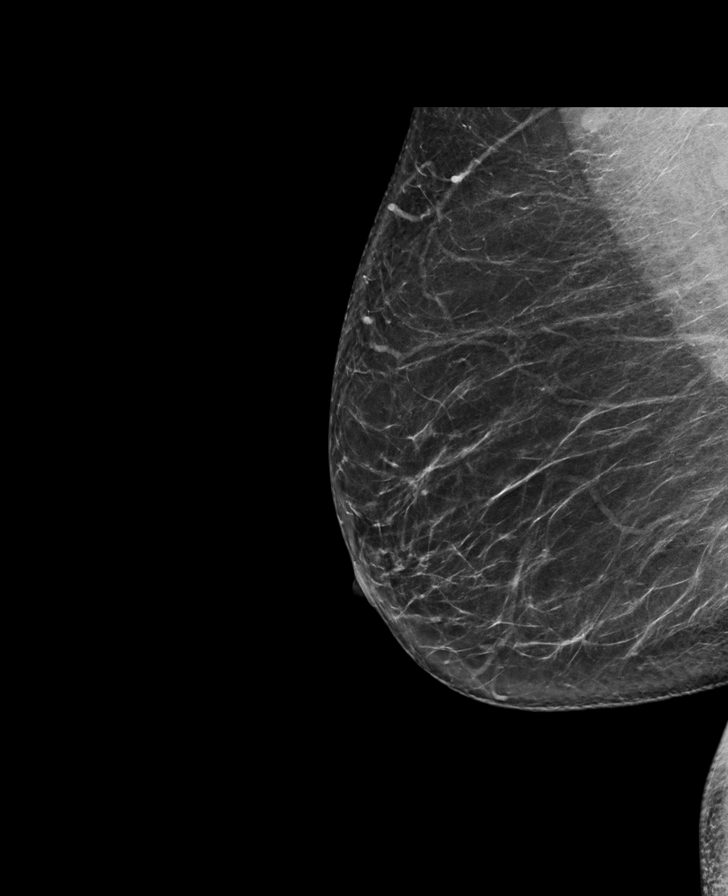

[L CC synth-2D]
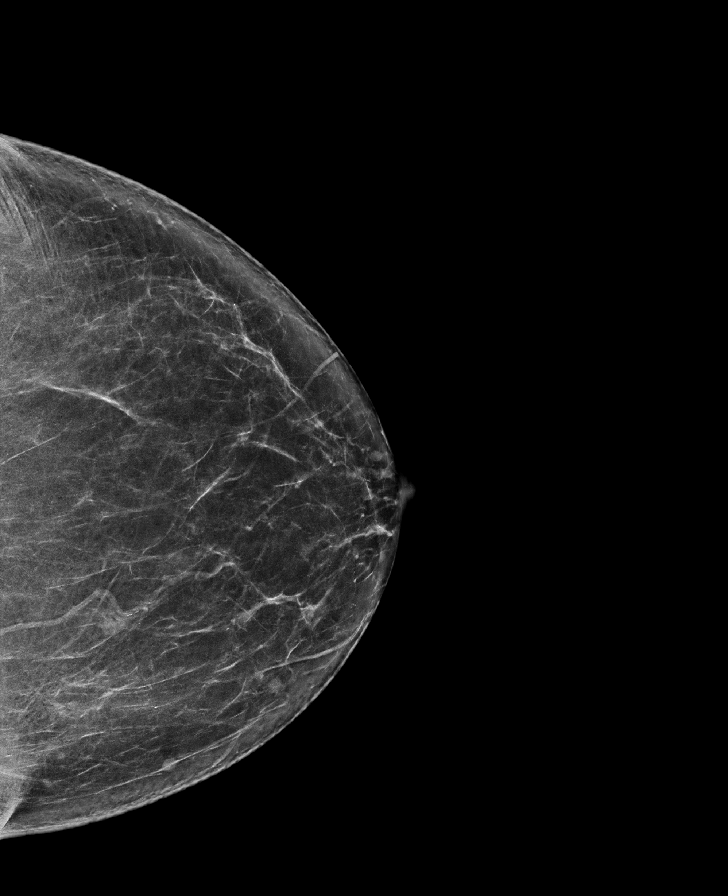

[R CC synth-2D]
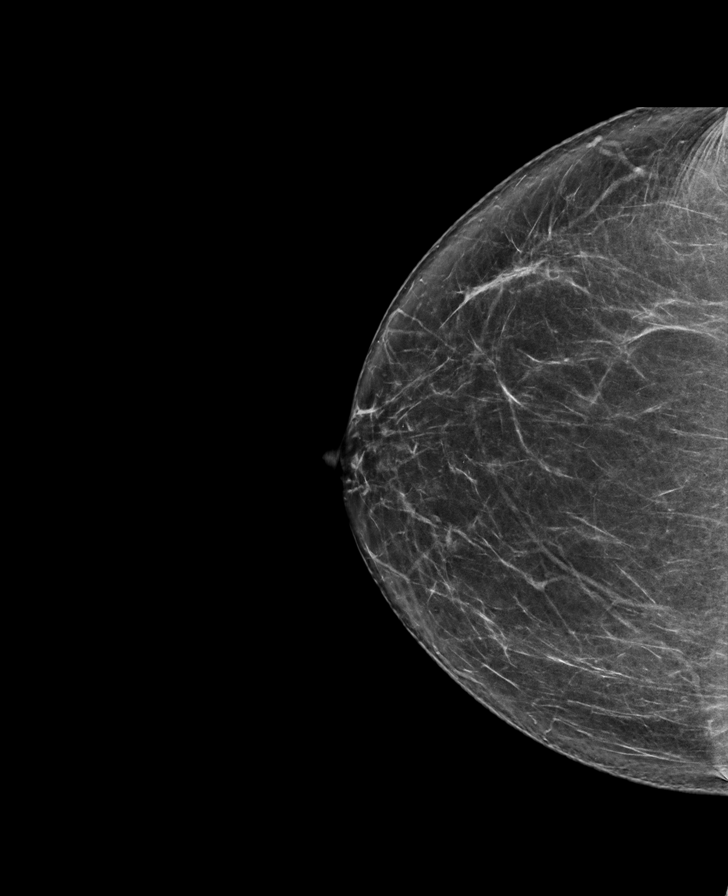

[L MLO tomo · tomo slice 44/87.0]
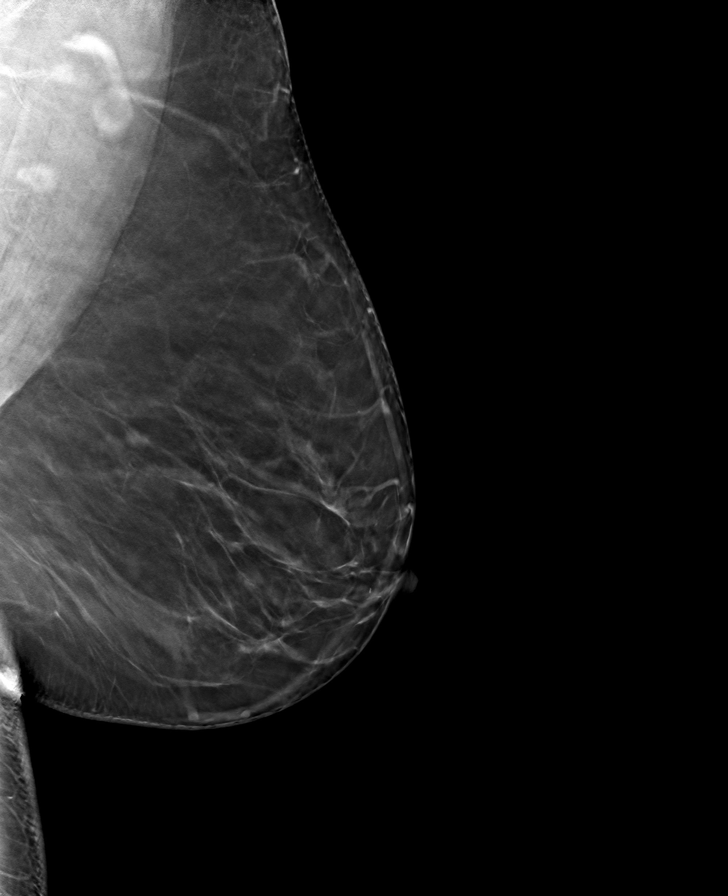

[R MLO tomo · tomo slice 43/86.0]
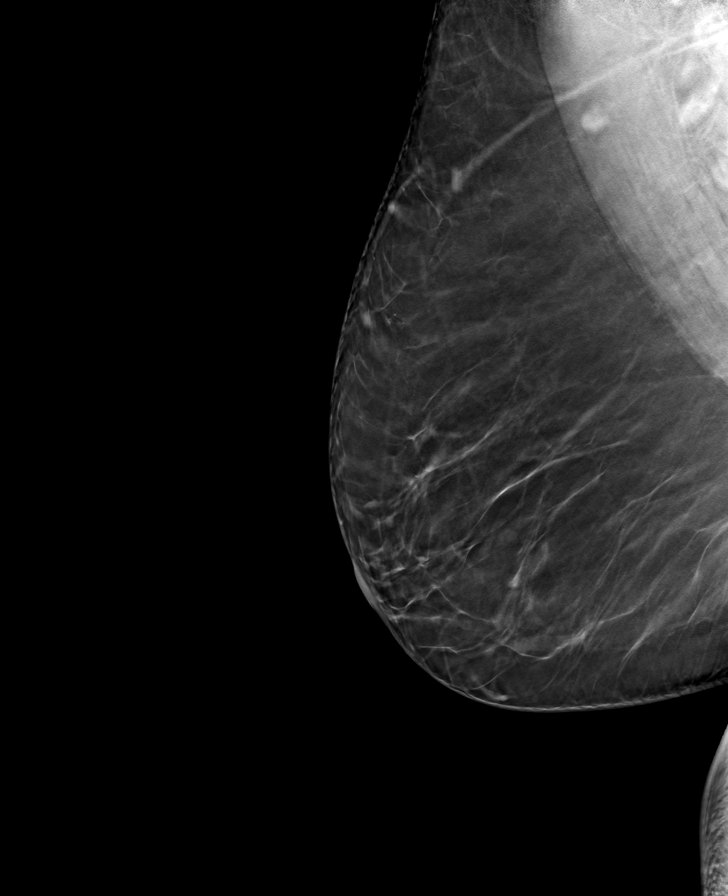

[R CC tomo · tomo slice 39/78.0]
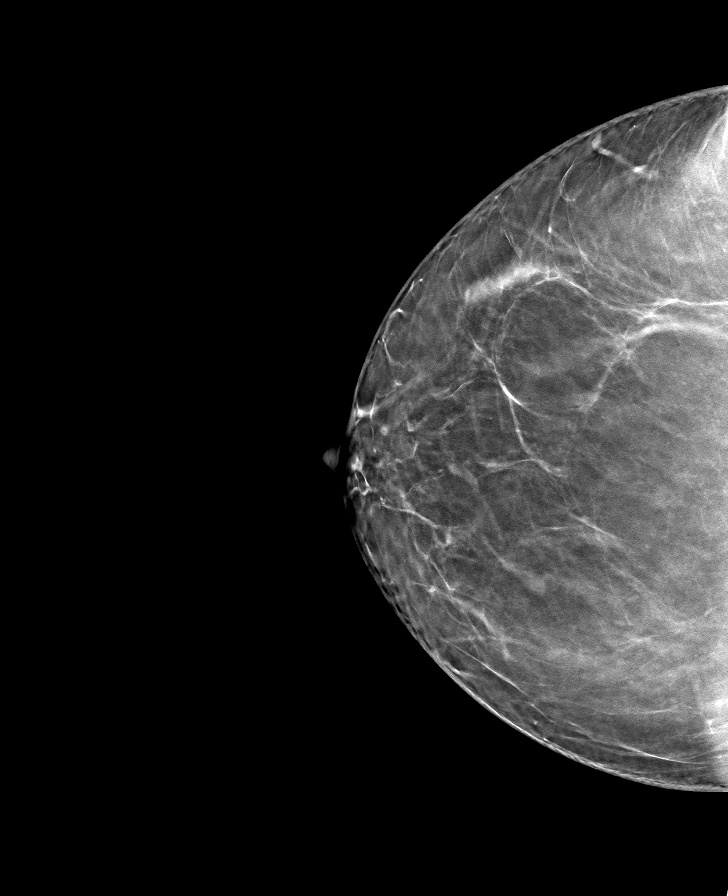

[L CC tomo · tomo slice 39/77.0]
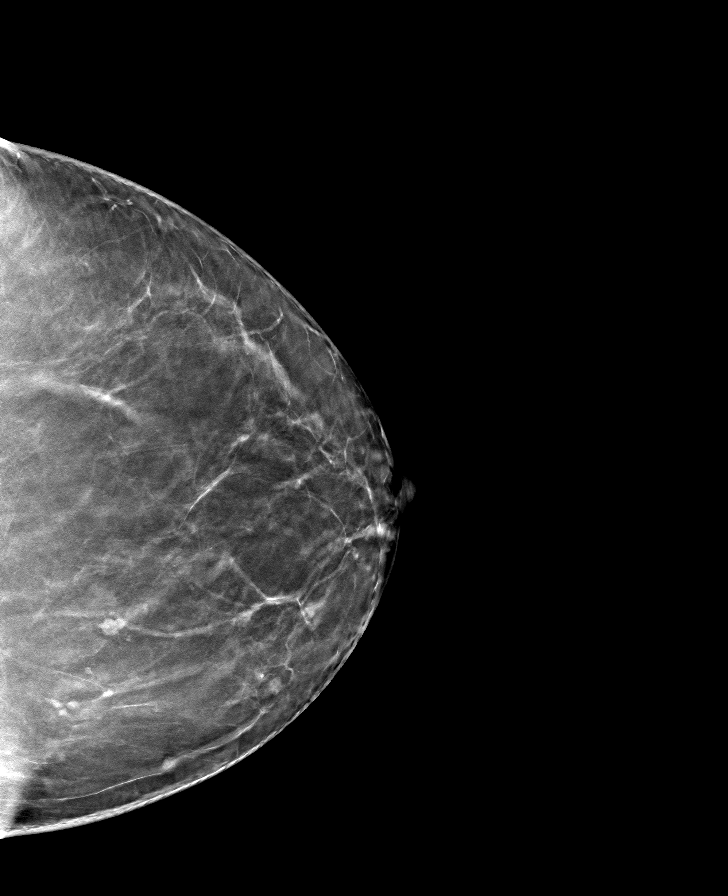

[8 of 24 positions shown; findings below may reference images not displayed]

ACR Breast Density Category b: There are scattered areas of
fibroglandular density.
FINDINGS: There are no findings suspicious for malignancy.
IMPRESSION: No mammographic evidence of malignancy. A result letter of this
screening mammogram will be mailed directly to the patient.

RECOMMENDATION:
Screening mammogram in one year. (Code:51-O-LD2)

BI-RADS CATEGORY  1: Negative.

## 2022-04-20 ENCOUNTER — Encounter: Payer: Self-pay | Admitting: Gastroenterology

## 2022-09-02 ENCOUNTER — Other Ambulatory Visit: Payer: Self-pay | Admitting: Family Medicine

## 2022-09-02 DIAGNOSIS — Z1231 Encounter for screening mammogram for malignant neoplasm of breast: Secondary | ICD-10-CM

## 2022-10-05 ENCOUNTER — Other Ambulatory Visit: Payer: Self-pay | Admitting: Family Medicine

## 2022-10-05 DIAGNOSIS — E2839 Other primary ovarian failure: Secondary | ICD-10-CM

## 2022-10-07 ENCOUNTER — Ambulatory Visit
Admission: RE | Admit: 2022-10-07 | Discharge: 2022-10-07 | Disposition: A | Discharge status: Home / Self Care | Payer: Medicare Other | Source: Ambulatory Visit | Attending: Family Medicine | Admitting: Family Medicine

## 2022-10-07 DIAGNOSIS — Z1231 Encounter for screening mammogram for malignant neoplasm of breast: Secondary | ICD-10-CM

## 2023-04-27 ENCOUNTER — Ambulatory Visit
Admission: RE | Admit: 2023-04-27 | Discharge: 2023-04-27 | Disposition: A | Discharge status: Home / Self Care | Payer: Medicare Other | Source: Ambulatory Visit | Attending: Family Medicine | Admitting: Family Medicine

## 2023-04-27 DIAGNOSIS — E2839 Other primary ovarian failure: Secondary | ICD-10-CM

## 2023-08-25 ENCOUNTER — Encounter: Payer: Medicare Other | Admitting: Obstetrics and Gynecology

## 2023-10-04 ENCOUNTER — Other Ambulatory Visit: Payer: Self-pay | Admitting: Family Medicine

## 2023-10-04 DIAGNOSIS — Z0000 Encounter for general adult medical examination without abnormal findings: Secondary | ICD-10-CM

## 2023-10-08 ENCOUNTER — Ambulatory Visit
Admission: RE | Admit: 2023-10-08 | Discharge: 2023-10-08 | Disposition: A | Discharge status: Home / Self Care | Source: Ambulatory Visit | Attending: Family Medicine | Admitting: Family Medicine

## 2023-10-08 DIAGNOSIS — Z0000 Encounter for general adult medical examination without abnormal findings: Secondary | ICD-10-CM

## 2023-10-13 ENCOUNTER — Encounter: Payer: Self-pay | Admitting: Obstetrics and Gynecology

## 2023-10-19 ENCOUNTER — Ambulatory Visit: Payer: Medicare Other | Admitting: Obstetrics and Gynecology

## 2023-10-19 ENCOUNTER — Other Ambulatory Visit (HOSPITAL_COMMUNITY)
Admission: RE | Admit: 2023-10-19 | Discharge: 2023-10-19 | Disposition: A | Discharge status: Home / Self Care | Source: Ambulatory Visit | Attending: Obstetrics and Gynecology | Admitting: Obstetrics and Gynecology

## 2023-10-19 ENCOUNTER — Encounter: Payer: Self-pay | Admitting: Obstetrics and Gynecology

## 2023-10-19 VITALS — BP 124/72 | HR 77 | Temp 98.0°F | Ht 65.25 in | Wt 217.0 lb

## 2023-10-19 DIAGNOSIS — Z01419 Encounter for gynecological examination (general) (routine) without abnormal findings: Secondary | ICD-10-CM | POA: Diagnosis not present

## 2023-10-19 DIAGNOSIS — Z124 Encounter for screening for malignant neoplasm of cervix: Secondary | ICD-10-CM

## 2023-10-19 DIAGNOSIS — N841 Polyp of cervix uteri: Secondary | ICD-10-CM | POA: Diagnosis present

## 2023-10-19 DIAGNOSIS — Z1151 Encounter for screening for human papillomavirus (HPV): Secondary | ICD-10-CM | POA: Diagnosis not present

## 2023-10-19 NOTE — Assessment & Plan Note (Signed)
 Cervical cancer screening performed according to ASCCP guidelines. Encouraged annual mammogram screening Colonoscopy UTD DXA UTD Labs and immunizations with her primary Encouraged safe sexual practices as indicated Encouraged healthy lifestyle practices with diet and exercise For patients under 50-70yo, I recommend 1200mg  calcium daily and 600IU of vitamin D daily.

## 2023-10-19 NOTE — Progress Notes (Signed)
 68 y.o. Donna Le postmenopausal female here for annual exam (>9yr since last visit). Married. Retired Charity fundraiser.  No concerns. Mild SUI.  Postmenopausal bleeding: none Pelvic discharge or pain: none Breast mass, nipple discharge or skin changes : none Last PAP: 2017 NIL, HPV neg Last mammogram: 10/08/23 BIRADS 1, density a Last colonoscopy: 2024, +polyps, q22yr Last DXA: 04/27/23 normal Sexually active: no  Exercising: yes, walk, cardio Smoker: no  GYN HISTORY: No significant history  OB History  Gravida Para Term Preterm AB Living  4 3 3  1 3   SAB IAB Ectopic Multiple Live Births          # Outcome Date GA Lbr Len/2nd Weight Sex Type Anes PTL Lv  4 Term 01/1989    M Vag-Spont     3 Term 10/1986    M Vag-Spont     2 Term 08/1979    M      1 AB             Past Medical History:  Diagnosis Date   Allergy    Anemia    Dental crowns present    Fibroid    uterus   Heart murmur    Lipoma of neck 08/2012   right    Past Surgical History:  Procedure Laterality Date   CHOLECYSTECTOMY     COLONOSCOPY     LIPOMA EXCISION  08/26/2012   Procedure: EXCISION LIPOMA;  Surgeon: Drema Halon, MD;  Location: [facility];  Service: ENT;  Laterality: Right;   TUBAL LIGATION  1990    Current Outpatient Medications on File Prior to Visit  Medication Sig Dispense Refill   Cholecalciferol (VITAMIN D3) 2000 units CHEW Chew by mouth.     rosuvastatin (CRESTOR) 20 MG tablet 1 tablet     pantoprazole (PROTONIX) 40 MG tablet 1 tablet (Patient not taking: Reported on 10/19/2023)     Current Facility-Administered Medications on File Prior to Visit  Medication Dose Route Frequency Provider Last Rate Last Admin   0.9 %  sodium chloride infusion  500 mL Intravenous Continuous Rachael Fee, MD        Social History   Socioeconomic History   Marital status: Married    Spouse name: Not on file   Number of children: Not on file   Years of education: Not on file   Highest  education level: Not on file  Occupational History   Not on file  Tobacco Use   Smoking status: Never   Smokeless tobacco: Never  Vaping Use   Vaping status: Never Used  Substance and Sexual Activity   Alcohol use: No   Drug use: No   Sexual activity: Not Currently    Partners: Male    Birth control/protection: Post-menopausal, Surgical  Other Topics Concern   Not on file  Social History Narrative   Not on file   Social Drivers of Health   Financial Resource Strain: Not on file  Food Insecurity: Not on file  Transportation Needs: Not on file  Physical Activity: Not on file  Stress: Not on file  Social Connections: Not on file  Intimate Partner Violence: Unknown (11/06/2021)   Received from [facility]   HITS    Physically Hurt: Not on file    Insult or Talk Down To: Not on file    Threaten Physical Harm: Not on file    Scream or Curse: Not on file    Family History  Problem  Relation Age of Onset   Breast cancer Mother 20   Anesthesia problems Sister        hard to wake up post-op   Diabetes Maternal Grandmother    Diabetes Paternal Grandmother    Pancreatic cancer Cousin    Colon cancer Neg Hx    Colon polyps Neg Hx    Esophageal cancer Neg Hx    Rectal cancer Neg Hx    Stomach cancer Neg Hx     Allergies  Allergen Reactions   Shellfish-Derived Products     Other reaction(s): Unknown      PE Today's Vitals   10/19/23 0932  BP: 124/72  Pulse: 77  Temp: 98 F (36.7 C)  TempSrc: Oral  SpO2: 96%  Weight: 217 lb (98.4 kg)  Height: 5' 5.25" (1.657 m)   Body mass index is 35.83 kg/m.  Physical Exam Vitals reviewed. Exam conducted with a chaperone present.  Constitutional:      General: She is not in acute distress.    Appearance: Normal appearance.  HENT:     Head: Normocephalic and atraumatic.     Nose: Nose normal.  Eyes:     Extraocular Movements: Extraocular movements intact.     Conjunctiva/sclera: Conjunctivae normal.   Neck:     Thyroid: No thyroid mass, thyromegaly or thyroid tenderness.  Pulmonary:     Effort: Pulmonary effort is normal.  Chest:     Chest wall: No mass or tenderness.  Breasts:    Right: Normal. No swelling, mass, nipple discharge, skin change or tenderness.     Left: Normal. No swelling, mass, nipple discharge, skin change or tenderness.  Abdominal:     General: There is no distension.     Palpations: Abdomen is soft.     Tenderness: There is no abdominal tenderness.  Genitourinary:    General: Normal vulva.     Exam position: Lithotomy position.     Urethra: No prolapse.     Vagina: Normal. No vaginal discharge or bleeding.     Cervix: Normal. No lesion.     Uterus: Normal. Not enlarged and not tender.      Adnexa: Right adnexa normal and left adnexa normal.        Comments: Mobile lesion of cervix, polyp vs nabothian cyst, with biopsy, mucinous material expelled Musculoskeletal:        General: Normal range of motion.     Cervical back: Normal range of motion.  Lymphadenopathy:     Upper Body:     Right upper body: No axillary adenopathy.     Left upper body: No axillary adenopathy.     Lower Body: No right inguinal adenopathy. No left inguinal adenopathy.  Skin:    General: Skin is warm and dry.  Neurological:     General: No focal deficit present.     Mental Status: She is alert.  Psychiatric:        Mood and Affect: Mood normal.        Behavior: Behavior normal.      Assessment and Plan:        Well woman exam with routine gynecological exam Assessment & Plan: Cervical cancer screening performed according to ASCCP guidelines. Encouraged annual mammogram screening Colonoscopy UTD DXA UTD Labs and immunizations with her primary Encouraged safe sexual practices as indicated Encouraged healthy lifestyle practices with diet and exercise For patients under 50-70yo, I recommend 1200mg  calcium daily and 600IU of vitamin D daily.    Cervical cancer  screening -     Cytology - PAP  Cervical polyp -     Surgical pathology -     Ambulatory Referral For Surgery Scheduling  Biopsy taken, however patient desires procedure for complete removal Plan for hysteroscopy, dilation and curettage, polypectomy. Discussed outpatient procedure. Will need to return for preop exam.  Rosalyn Gess, MD

## 2023-10-19 NOTE — Patient Instructions (Signed)
 For patients under 50-70yo, I recommend 1200mg  calcium daily and 600IU of vitamin D daily. For patients over 70yo, I recommend 1200mg  calcium daily and 800IU of vitamin D daily.  Health Maintenance, Female Adopting a healthy lifestyle and getting preventive care are important in promoting health and wellness. Ask your health care provider about: The right schedule for you to have regular tests and exams. Things you can do on your own to prevent diseases and keep yourself healthy. What should I know about diet, weight, and exercise? Eat a healthy diet  Eat a diet that includes plenty of vegetables, fruits, low-fat dairy products, and lean protein. Do not eat a lot of foods that are high in solid fats, added sugars, or sodium. Maintain a healthy weight Body mass index (BMI) is used to identify weight problems. It estimates body fat based on height and weight. Your health care provider can help determine your BMI and help you achieve or maintain a healthy weight. Get regular exercise Get regular exercise. This is one of the most important things you can do for your health. Most adults should: Exercise for at least 150 minutes each week. The exercise should increase your heart rate and make you sweat (moderate-intensity exercise). Do strengthening exercises at least twice a week. This is in addition to the moderate-intensity exercise. Spend less time sitting. Even light physical activity can be beneficial. Watch cholesterol and blood lipids Have your blood tested for lipids and cholesterol at 69 years of age, then have this test every 5 years. Have your cholesterol levels checked more often if: Your lipid or cholesterol levels are high. You are older than 69 years of age. You are at high risk for heart disease. What should I know about cancer screening? Depending on your health history and family history, you may need to have cancer screening at various ages. This may include screening  for: Breast cancer. Cervical cancer. Colorectal cancer. Skin cancer. Lung cancer. What should I know about heart disease, diabetes, and high blood pressure? Blood pressure and heart disease High blood pressure causes heart disease and increases the risk of stroke. This is more likely to develop in people who have high blood pressure readings or are overweight. Have your blood pressure checked: Every 3-5 years if you are 69-23 years of age. Every year if you are 69 years old or older. Diabetes Have regular diabetes screenings. This checks your fasting blood sugar level. Have the screening done: Once every three years after age 69 if you are at a normal weight and have a low risk for diabetes. More often and at a younger age if you are overweight or have a high risk for diabetes. What should I know about preventing infection? Hepatitis B If you have a higher risk for hepatitis B, you should be screened for this virus. Talk with your health care provider to find out if you are at risk for hepatitis B infection. Hepatitis C Testing is recommended for: Everyone born from [birth year] through 1965. Anyone with known risk factors for hepatitis C. Sexually transmitted infections (STIs) Get screened for STIs, including gonorrhea and chlamydia, if: You are sexually active and are younger than 69 years of age. You are older than 69 years of age and your health care provider tells you that you are at risk for this type of infection. Your sexual activity has changed since you were last screened, and you are at increased risk for chlamydia or gonorrhea. Ask your health care provider if  you are at risk. Ask your health care provider about whether you are at high risk for HIV. Your health care provider may recommend a prescription medicine to help prevent HIV infection. If you choose to take medicine to prevent HIV, you should first get tested for HIV. You should then be tested every 3 months for as long as you  are taking the medicine. Osteoporosis and menopause Osteoporosis is a disease in which the bones lose minerals and strength with aging. This can result in bone fractures. If you are 4 years old or older, or if you are at risk for osteoporosis and fractures, ask your health care provider if you should: Be screened for bone loss. Take a calcium or vitamin D supplement to lower your risk of fractures. Be given hormone replacement therapy (HRT) to treat symptoms of menopause. Follow these instructions at home: Alcohol use Do not drink alcohol if: Your health care provider tells you not to drink. You are pregnant, may be pregnant, or are planning to become pregnant. If you drink alcohol: Limit how much you have to: 0-1 drink a day. Know how much alcohol is in your drink. In the [place] one drink equals one 12 oz bottle of beer (355 mL), one 5 oz glass of wine (148 mL), or one 1 oz glass of hard liquor (44 mL). Lifestyle Do not use any products that contain nicotine or tobacco. These products include cigarettes, chewing tobacco, and vaping devices, such as e-cigarettes. If you need help quitting, ask your health care provider. Do not use street drugs. Do not share needles. Ask your health care provider for help if you need support or information about quitting drugs. General instructions Schedule regular health, dental, and eye exams. Stay current with your vaccines. Tell your health care provider if: You often feel depressed. You have ever been abused or do not feel safe at home. Summary Adopting a healthy lifestyle and getting preventive care are important in promoting health and wellness. Follow your health care provider's instructions about healthy diet, exercising, and getting tested or screened for diseases. Follow your health care provider's instructions on monitoring your cholesterol and blood pressure. This information is not intended to replace advice given to you by your health  care provider. Make sure you discuss any questions you have with your health care provider. Document Revised: 12/09/2020 Document Reviewed: 12/09/2020 Elsevier Patient Education  2024 [employer].

## 2023-10-20 LAB — SURGICAL PATHOLOGY

## 2023-10-21 ENCOUNTER — Encounter: Payer: Self-pay | Admitting: Obstetrics and Gynecology

## 2023-10-21 LAB — CYTOLOGY - PAP
Comment: NEGATIVE
Diagnosis: NEGATIVE
High risk HPV: NEGATIVE

## 2023-11-08 ENCOUNTER — Encounter: Payer: Self-pay | Admitting: *Deleted

## 2023-11-10 ENCOUNTER — Encounter: Payer: Self-pay | Admitting: Obstetrics and Gynecology

## 2023-11-10 ENCOUNTER — Ambulatory Visit (INDEPENDENT_AMBULATORY_CARE_PROVIDER_SITE_OTHER): Admitting: Obstetrics and Gynecology

## 2023-11-10 VITALS — BP 110/64 | HR 57 | Temp 98.0°F | Ht 65.5 in | Wt 218.0 lb

## 2023-11-10 DIAGNOSIS — N841 Polyp of cervix uteri: Secondary | ICD-10-CM

## 2023-11-10 NOTE — Progress Notes (Signed)
 68 y.o. [id number] postmenopausal female with cervical polyp, known fibroids here for surgical consultation for diagnostic hysteroscopy, dilation and curettage, polypectomy. Married. Retired Charity fundraiser.   Patient's last menstrual period was 08/03/2005.   No CP or SOB, PMB or pelvic pain. Last PAP:    Component Value Date/Time   DIAGPAP  10/19/2023 1014    - Negative for intraepithelial lesion or malignancy (NILM)   HPVHIGH Negative 10/19/2023 1014   ADEQPAP  10/19/2023 1014    Satisfactory for evaluation; transformation zone component PRESENT.   10/19/23 pathology: CERVIX, POLYPECTOMY:  - Benign mucinous cyst, consistent with Nabothian cysts  - Negative for dysplasia malignancy   GYN HISTORY: No significant history  OB History  Gravida Para Term Preterm AB Living  4 3 3  1 3   SAB IAB Ectopic Multiple Live Births          # Outcome Date GA Lbr Len/2nd Weight Sex Type Anes PTL Lv  4 Term 01/1989    M Vag-Spont     3 Term 10/1986    M Vag-Spont     2 Term 08/1979    M      1 AB             Past Medical History:  Diagnosis Date   Allergy    Anemia    Dental crowns present    Fibroid    uterus   Heart murmur    Lipoma of neck 08/2012   right    Past Surgical History:  Procedure Laterality Date   CHOLECYSTECTOMY     COLONOSCOPY     LIPOMA EXCISION  08/26/2012   Procedure: EXCISION LIPOMA;  Surgeon: Drema Halon, MD;  Location: [facility];  Service: ENT;  Laterality: Right;   TUBAL LIGATION  1990    Current Outpatient Medications on File Prior to Visit  Medication Sig Dispense Refill   Cholecalciferol (VITAMIN D3) 2000 units CHEW Chew by mouth.     rosuvastatin (CRESTOR) 20 MG tablet 1 tablet     terbinafine (LAMISIL) 250 MG tablet Take 250 mg by mouth daily. 3 months only     No current facility-administered medications on file prior to visit.    Allergies  Allergen Reactions   Shellfish-Derived Products     Other reaction(s): Unknown       PE Today's Vitals   11/10/23 1455  BP: 110/64  Pulse: (!) 57  Temp: 98 F (36.7 C)  TempSrc: Oral  SpO2: 95%  Weight: 218 lb (98.9 kg)  Height: 5' 5.5" (1.664 m)   Body mass index is 35.73 kg/m.  Physical Exam Vitals reviewed.  Constitutional:      General: She is not in acute distress.    Appearance: Normal appearance.  HENT:     Head: Normocephalic and atraumatic.     Nose: Nose normal.  Eyes:     Extraocular Movements: Extraocular movements intact.     Conjunctiva/sclera: Conjunctivae normal.  Cardiovascular:     Rate and Rhythm: Normal rate and regular rhythm.     Heart sounds: No murmur heard.    No friction rub. No gallop.  Pulmonary:     Effort: Pulmonary effort is normal. No respiratory distress.     Breath sounds: Normal breath sounds. No stridor. No wheezing, rhonchi or rales.  Musculoskeletal:        General: Normal range of motion.     Cervical back: Normal range of motion.  Neurological:  General: No focal deficit present.     Mental Status: She is alert.  Psychiatric:        Mood and Affect: Mood normal.        Behavior: Behavior normal.      Assessment and Plan:        Cervical polyp Assessment & Plan: Cervical lesion biopsied, most consistent with nabothian cyst. Offered patient SIS vs hysteroscopy for further evaluation. She would like to proceed with disgnostic hysteroscopy, dilation and curettage, possible polypectomy.  Discussed outpatient procedure. Reviewed that  recovery is usually 1-2 days. Risks including infections, bleeding, and damage to surrounding organs reviewed. Recommend NPO prior to midnight and reviewed medication to take on day of surgery. Dicussed use of NSAIDS as needed for pain postoperatively.  Preop checklist: Antibiotics: none DVT ppx: SCDs Postop visit: 1 week Additional clearance: none      Rosalyn Gess, MD

## 2023-11-10 NOTE — Assessment & Plan Note (Signed)
 Cervical lesion biopsied, most consistent with nabothian cyst. Offered patient SIS vs hysteroscopy for further evaluation. She would like to proceed with disgnostic hysteroscopy, dilation and curettage, possible polypectomy.  Discussed outpatient procedure. Reviewed that  recovery is usually 1-2 days. Risks including infections, bleeding, and damage to surrounding organs reviewed. Recommend NPO prior to midnight and reviewed medication to take on day of surgery. Dicussed use of NSAIDS as needed for pain postoperatively.  Preop checklist: Antibiotics: none DVT ppx: SCDs Postop visit: 1 week Additional clearance: none

## 2023-11-10 NOTE — H&P (View-Only) (Signed)
 68 y.o. [id number] postmenopausal female with cervical polyp, known fibroids here for surgical consultation for diagnostic hysteroscopy, dilation and curettage, polypectomy. Married. Retired Charity fundraiser.   Patient's last menstrual period was 08/03/2005.   No CP or SOB, PMB or pelvic pain. Last PAP:    Component Value Date/Time   DIAGPAP  10/19/2023 1014    - Negative for intraepithelial lesion or malignancy (NILM)   HPVHIGH Negative 10/19/2023 1014   ADEQPAP  10/19/2023 1014    Satisfactory for evaluation; transformation zone component PRESENT.   10/19/23 pathology: CERVIX, POLYPECTOMY:  - Benign mucinous cyst, consistent with Nabothian cysts  - Negative for dysplasia malignancy   GYN HISTORY: No significant history  OB History  Gravida Para Term Preterm AB Living  4 3 3  1 3   SAB IAB Ectopic Multiple Live Births          # Outcome Date GA Lbr Len/2nd Weight Sex Type Anes PTL Lv  4 Term 01/1989    M Vag-Spont     3 Term 10/1986    M Vag-Spont     2 Term 08/1979    M      1 AB             Past Medical History:  Diagnosis Date   Allergy    Anemia    Dental crowns present    Fibroid    uterus   Heart murmur    Lipoma of neck 08/2012   right    Past Surgical History:  Procedure Laterality Date   CHOLECYSTECTOMY     COLONOSCOPY     LIPOMA EXCISION  08/26/2012   Procedure: EXCISION LIPOMA;  Surgeon: Drema Halon, MD;  Location: [facility];  Service: ENT;  Laterality: Right;   TUBAL LIGATION  1990    Current Outpatient Medications on File Prior to Visit  Medication Sig Dispense Refill   Cholecalciferol (VITAMIN D3) 2000 units CHEW Chew by mouth.     rosuvastatin (CRESTOR) 20 MG tablet 1 tablet     terbinafine (LAMISIL) 250 MG tablet Take 250 mg by mouth daily. 3 months only     No current facility-administered medications on file prior to visit.    Allergies  Allergen Reactions   Shellfish-Derived Products     Other reaction(s): Unknown       PE Today's Vitals   11/10/23 1455  BP: 110/64  Pulse: (!) 57  Temp: 98 F (36.7 C)  TempSrc: Oral  SpO2: 95%  Weight: 218 lb (98.9 kg)  Height: 5' 5.5" (1.664 m)   Body mass index is 35.73 kg/m.  Physical Exam Vitals reviewed.  Constitutional:      General: She is not in acute distress.    Appearance: Normal appearance.  HENT:     Head: Normocephalic and atraumatic.     Nose: Nose normal.  Eyes:     Extraocular Movements: Extraocular movements intact.     Conjunctiva/sclera: Conjunctivae normal.  Cardiovascular:     Rate and Rhythm: Normal rate and regular rhythm.     Heart sounds: No murmur heard.    No friction rub. No gallop.  Pulmonary:     Effort: Pulmonary effort is normal. No respiratory distress.     Breath sounds: Normal breath sounds. No stridor. No wheezing, rhonchi or rales.  Musculoskeletal:        General: Normal range of motion.     Cervical back: Normal range of motion.  Neurological:  General: No focal deficit present.     Mental Status: She is alert.  Psychiatric:        Mood and Affect: Mood normal.        Behavior: Behavior normal.      Assessment and Plan:        Cervical polyp Assessment & Plan: Cervical lesion biopsied, most consistent with nabothian cyst. Offered patient SIS vs hysteroscopy for further evaluation. She would like to proceed with disgnostic hysteroscopy, dilation and curettage, possible polypectomy.  Discussed outpatient procedure. Reviewed that  recovery is usually 1-2 days. Risks including infections, bleeding, and damage to surrounding organs reviewed. Recommend NPO prior to midnight and reviewed medication to take on day of surgery. Dicussed use of NSAIDS as needed for pain postoperatively.  Preop checklist: Antibiotics: none DVT ppx: SCDs Postop visit: 1 week Additional clearance: none      Rosalyn Gess, MD

## 2023-11-15 ENCOUNTER — Encounter (HOSPITAL_COMMUNITY): Payer: Self-pay | Admitting: Obstetrics and Gynecology

## 2023-11-15 NOTE — Progress Notes (Signed)
 Spoke w/ via phone for pre-op interview--- Ammon Bales Lab needs dos---- NONE        Lab results------ COVID test -----patient states asymptomatic no test needed Arrive at -------0800 NPO after MN NO Solid Food.  Clear liquids from MN until---0700 Pre-Surgery Ensure or G2:  Med rec completed Medications to take morning of surgery -----NONE Diabetic medication -----  GLP1 agonist last dose: GLP1 instructions:  Patient instructed no nail polish to be worn day of surgery Patient instructed to bring photo id and insurance card day of surgery Patient aware to have Driver (ride ) / caregiver    for 24 hours after surgery - Husband Linnette Richer Patient Special Instructions -----Shower with antibacterial soap. Pre-Op special Instructions -----  Patient verbalized understanding of instructions that were given at this phone interview. Patient denies chest pain, sob, fever, cough at the interview.

## 2023-11-23 ENCOUNTER — Encounter (HOSPITAL_COMMUNITY)
Admission: RE | Disposition: A | Discharge status: Home / Self Care | Payer: Self-pay | Source: Home / Self Care | Attending: Obstetrics and Gynecology

## 2023-11-23 ENCOUNTER — Ambulatory Visit (HOSPITAL_COMMUNITY): Admitting: Anesthesiology

## 2023-11-23 ENCOUNTER — Encounter (HOSPITAL_COMMUNITY): Payer: Self-pay | Admitting: Obstetrics and Gynecology

## 2023-11-23 ENCOUNTER — Ambulatory Visit (HOSPITAL_COMMUNITY)
Admission: RE | Admit: 2023-11-23 | Discharge: 2023-11-23 | Disposition: A | Discharge status: Home / Self Care | Attending: Obstetrics and Gynecology | Admitting: Obstetrics and Gynecology

## 2023-11-23 ENCOUNTER — Ambulatory Visit (HOSPITAL_BASED_OUTPATIENT_CLINIC_OR_DEPARTMENT_OTHER): Admitting: Anesthesiology

## 2023-11-23 ENCOUNTER — Other Ambulatory Visit: Payer: Self-pay

## 2023-11-23 DIAGNOSIS — I10 Essential (primary) hypertension: Secondary | ICD-10-CM | POA: Diagnosis not present

## 2023-11-23 DIAGNOSIS — R7303 Prediabetes: Secondary | ICD-10-CM | POA: Diagnosis not present

## 2023-11-23 DIAGNOSIS — N841 Polyp of cervix uteri: Secondary | ICD-10-CM

## 2023-11-23 DIAGNOSIS — E119 Type 2 diabetes mellitus without complications: Secondary | ICD-10-CM | POA: Diagnosis not present

## 2023-11-23 DIAGNOSIS — E7800 Pure hypercholesterolemia, unspecified: Secondary | ICD-10-CM

## 2023-11-23 HISTORY — PX: DILATATION & CURRETTAGE/HYSTEROSCOPY WITH RESECTOCOPE: SHX5572

## 2023-11-23 HISTORY — DX: Prediabetes: R73.03

## 2023-11-23 SURGERY — DILATATION & CURETTAGE/HYSTEROSCOPY WITH RESECTOCOPE
Anesthesia: General

## 2023-11-23 MED ORDER — ORAL CARE MOUTH RINSE: 15.0000 mL | Freq: Once | OROMUCOSAL | Status: AC

## 2023-11-23 MED ORDER — LACTATED RINGERS IV SOLN: INTRAVENOUS | Status: DC

## 2023-11-23 MED ORDER — LIDOCAINE 2% (20 MG/ML) 5 ML SYRINGE
INTRAMUSCULAR | Status: DC | PRN
  Administered 2023-11-23: 60 mg via INTRAVENOUS

## 2023-11-23 MED ORDER — DEXAMETHASONE SODIUM PHOSPHATE 10 MG/ML IJ SOLN
INTRAMUSCULAR | Status: AC
  Filled 2023-11-23: qty 1

## 2023-11-23 MED ORDER — AMISULPRIDE (ANTIEMETIC) 5 MG/2ML IV SOLN: 10.0000 mg | Freq: Once | INTRAVENOUS | Status: DC | PRN

## 2023-11-23 MED ORDER — ONDANSETRON HCL 4 MG/2ML IJ SOLN
INTRAMUSCULAR | Status: AC
  Filled 2023-11-23: qty 2

## 2023-11-23 MED ORDER — ONDANSETRON HCL 4 MG/2ML IJ SOLN
INTRAMUSCULAR | Status: DC | PRN
  Administered 2023-11-23: 4 mg via INTRAVENOUS

## 2023-11-23 MED ORDER — CHLORHEXIDINE GLUCONATE 0.12 % MT SOLN
15.0000 mL | Freq: Once | OROMUCOSAL | Status: AC
  Administered 2023-11-23: 15 mL via OROMUCOSAL

## 2023-11-23 MED ORDER — GLYCOPYRROLATE PF 0.2 MG/ML IJ SOSY
PREFILLED_SYRINGE | INTRAMUSCULAR | Status: AC
  Filled 2023-11-23: qty 1

## 2023-11-23 MED ORDER — LIDOCAINE HCL 1 % IJ SOLN
INTRAMUSCULAR | Status: AC
  Filled 2023-11-23: qty 20

## 2023-11-23 MED ORDER — GLYCOPYRROLATE PF 0.2 MG/ML IJ SOSY
PREFILLED_SYRINGE | INTRAMUSCULAR | Status: DC | PRN
  Administered 2023-11-23: .2 mg via INTRAVENOUS

## 2023-11-23 MED ORDER — KETOROLAC TROMETHAMINE 30 MG/ML IJ SOLN: 30.0000 mg | Freq: Once | INTRAMUSCULAR | Status: DC | PRN

## 2023-11-23 MED ORDER — KETOROLAC TROMETHAMINE 30 MG/ML IJ SOLN
INTRAMUSCULAR | Status: AC
  Filled 2023-11-23: qty 1

## 2023-11-23 MED ORDER — ONDANSETRON HCL 4 MG/2ML IJ SOLN: 4.0000 mg | Freq: Once | INTRAMUSCULAR | Status: DC | PRN

## 2023-11-23 MED ORDER — OXYCODONE HCL 5 MG PO TABS: 5.0000 mg | ORAL_TABLET | Freq: Once | ORAL | Status: DC | PRN

## 2023-11-23 MED ORDER — PHENYLEPHRINE 80 MCG/ML (10ML) SYRINGE FOR IV PUSH (FOR BLOOD PRESSURE SUPPORT)
PREFILLED_SYRINGE | INTRAVENOUS | Status: DC | PRN
Start: 2023-11-23 — End: 2023-11-23
  Administered 2023-11-23: 160 ug via INTRAVENOUS

## 2023-11-23 MED ORDER — HYDROMORPHONE HCL 1 MG/ML IJ SOLN: 0.2500 mg | INTRAMUSCULAR | Status: DC | PRN

## 2023-11-23 MED ORDER — CHLORHEXIDINE GLUCONATE 0.12 % MT SOLN
OROMUCOSAL | Status: AC
  Filled 2023-11-23: qty 15

## 2023-11-23 MED ORDER — LIDOCAINE HCL (PF) 1 % IJ SOLN
INTRAMUSCULAR | Status: DC | PRN
  Administered 2023-11-23: 10 mL

## 2023-11-23 MED ORDER — EPHEDRINE SULFATE-NACL 50-0.9 MG/10ML-% IV SOSY
PREFILLED_SYRINGE | INTRAVENOUS | Status: DC | PRN
  Administered 2023-11-23: 15 mg via INTRAVENOUS
  Administered 2023-11-23: 10 mg via INTRAVENOUS

## 2023-11-23 MED ORDER — PROPOFOL 10 MG/ML IV BOLUS
INTRAVENOUS | Status: DC | PRN
  Administered 2023-11-23: 200 mg via INTRAVENOUS

## 2023-11-23 MED ORDER — ACETAMINOPHEN 500 MG PO TABS
1000.0000 mg | ORAL_TABLET | ORAL | Status: AC
  Administered 2023-11-23: 1000 mg via ORAL

## 2023-11-23 MED ORDER — DEXAMETHASONE SODIUM PHOSPHATE 10 MG/ML IJ SOLN
INTRAMUSCULAR | Status: DC | PRN
  Administered 2023-11-23: 5 mg via INTRAVENOUS

## 2023-11-23 MED ORDER — MIDAZOLAM HCL 5 MG/5ML IJ SOLN
INTRAMUSCULAR | Status: DC | PRN
  Administered 2023-11-23: 2 mg via INTRAVENOUS

## 2023-11-23 MED ORDER — KETOROLAC TROMETHAMINE 30 MG/ML IJ SOLN
INTRAMUSCULAR | Status: DC | PRN
  Administered 2023-11-23: 30 mg via INTRAVENOUS

## 2023-11-23 MED ORDER — LIDOCAINE 2% (20 MG/ML) 5 ML SYRINGE
INTRAMUSCULAR | Status: AC
  Filled 2023-11-23: qty 5

## 2023-11-23 MED ORDER — MIDAZOLAM HCL 2 MG/2ML IJ SOLN
INTRAMUSCULAR | Status: AC
  Filled 2023-11-23: qty 2

## 2023-11-23 MED ORDER — FENTANYL CITRATE (PF) 250 MCG/5ML IJ SOLN
INTRAMUSCULAR | Status: AC
  Filled 2023-11-23: qty 5

## 2023-11-23 MED ORDER — OXYCODONE HCL 5 MG/5ML PO SOLN: 5.0000 mg | Freq: Once | ORAL | Status: DC | PRN

## 2023-11-23 MED ORDER — SODIUM CHLORIDE 0.9 % IR SOLN
Status: DC | PRN
  Administered 2023-11-23: 3000 mL

## 2023-11-23 MED ORDER — PROPOFOL 10 MG/ML IV BOLUS
INTRAVENOUS | Status: AC
  Filled 2023-11-23: qty 20

## 2023-11-23 MED ORDER — EPHEDRINE 5 MG/ML INJ
INTRAVENOUS | Status: AC
  Filled 2023-11-23: qty 5

## 2023-11-23 MED ORDER — FENTANYL CITRATE (PF) 100 MCG/2ML IJ SOLN
INTRAMUSCULAR | Status: DC | PRN
  Administered 2023-11-23: 50 ug via INTRAVENOUS

## 2023-11-23 MED ORDER — SILVER NITRATE-POT NITRATE 75-25 % EX MISC
CUTANEOUS | Status: DC | PRN
  Administered 2023-11-23: 2 via TOPICAL

## 2023-11-23 MED ORDER — DEXMEDETOMIDINE HCL IN NACL 80 MCG/20ML IV SOLN
INTRAVENOUS | Status: DC | PRN
  Administered 2023-11-23: 8 ug via INTRAVENOUS

## 2023-11-23 MED ORDER — ACETAMINOPHEN 500 MG PO TABS
ORAL_TABLET | ORAL | Status: AC
  Filled 2023-11-23: qty 2

## 2023-11-23 SURGICAL SUPPLY — 15 items
DEVICE MYOSURE LITE (MISCELLANEOUS) IMPLANT
GLOVE BIO SURGEON STRL SZ7 (GLOVE) ×2 IMPLANT
GLOVE BIOGEL PI IND STRL 7.0 (GLOVE) ×2 IMPLANT
GLOVE BIOGEL PI IND STRL 7.5 (GLOVE) IMPLANT
GOWN STRL REUS W/ TWL XL LVL3 (GOWN DISPOSABLE) ×2 IMPLANT
GOWN STRL SURGICAL XL XLNG (GOWN DISPOSABLE) IMPLANT
HIBICLENS CHG 4% 4OZ BTL (MISCELLANEOUS) ×2 IMPLANT
KIT PROCEDURE FLUENT (KITS) ×2 IMPLANT
KIT TURNOVER KIT B (KITS) ×2 IMPLANT
PACK VAGINAL MINOR WOMEN LF (CUSTOM PROCEDURE TRAY) ×2 IMPLANT
PAD OB MATERNITY 11 LF (PERSONAL CARE ITEMS) ×2 IMPLANT
PAD PREP 24X48 CUFFED NSTRL (MISCELLANEOUS) ×2 IMPLANT
SEAL ROD LENS SCOPE MYOSURE (ABLATOR) ×2 IMPLANT
SOL .9 NS 3000ML IRR UROMATIC (IV SOLUTION) ×2 IMPLANT
TOWEL OR 17X24 6PK STRL BLUE (TOWEL DISPOSABLE) ×2 IMPLANT

## 2023-11-23 NOTE — Op Note (Signed)
 11/23/2023 Donna Le [id number]  OPERATIVE REPORT  Preop Diagnosis: Pre-Op Diagnosis Codes:     * Cervical polyp [N84.1]   Post operative diagnosis: same  Procedure: Diagnostic hysteroscopy, dilation and curettage, polypectomy  Surgeon: Romaine Closs, MD Assistant: none  Anesthesia: MAC Fluids: please see anesthesia report Fluid deficit: 300cc  Complications: None  Findings: Normal cervix. Uterine cavity measuring 9cm with both ostia seen. Endometrial polypoid tissue and cervical polyp noted.  Estimated blood loss: Minimal  Specimens:  ID Type Source Tests Collected by Time Destination  1 :  Tissue PATH Gyn biopsy SURGICAL PATHOLOGY Romaine Closs, MD 11/23/2023 1148     Disposition of specimen: Pathology   Procedure: Patient was taken to the OR where she was placed in dorsal lithotomy in stirrups. She voided prior to transport. SCDs were in place.  The patient was prepped and draped in the usual sterile fashion. An adequate timeout was obtained and everyone agreed. A bivalve speculum was placed inside the vagina and the cervix visualized. The cervix was grasped anteriorly with a single-tooth tenaculum. Paracervical block was performed with 1% lidocaine .  The uterus sounded to 9 cm. Sequential cervical dilation was done to 25fr, and the myosure hysteroscope was introduced into the uterine cavity. The cervix and endometrial lining appeared normal both ostia were seen. Findings as noted. Endometrial and cervical polyps were resected with the Myosure.  This was also used to sample the entire cavity.  A sharp curettage was then performed to ensure complete sampling of the cavity and was sent to pathology. All instrument, sponge and lap counts were correct x2. The patient was awakened taken to recovery room in stable condition.  Romaine Closs MD 11/23/2023 2:22 PM

## 2023-11-23 NOTE — Anesthesia Procedure Notes (Signed)
 Procedure Name: LMA Insertion Date/Time: 11/23/2023 2:00 PM  Performed by: Ezzie Holstein, CRNAPre-anesthesia Checklist: Patient identified, Emergency Drugs available, Suction available and Patient being monitored Patient Re-evaluated:Patient Re-evaluated prior to induction Oxygen Delivery Method: Circle System Utilized Preoxygenation: Pre-oxygenation with 100% oxygen Induction Type: IV induction Ventilation: Mask ventilation without difficulty LMA: LMA inserted LMA Size: 4.0 Number of attempts: 1 Placement Confirmation: positive ETCO2 Tube secured with: Tape Dental Injury: Teeth and Oropharynx as per pre-operative assessment

## 2023-11-23 NOTE — Anesthesia Preprocedure Evaluation (Addendum)
 Anesthesia Evaluation  Patient identified by MRN, date of birth, ID band Patient awake    Reviewed: Allergy & Precautions, NPO status , Patient's Chart, lab work & pertinent test results  Airway Mallampati: III  TM Distance: >3 FB Neck ROM: Full    Dental no notable dental hx. (+) Teeth Intact, Dental Advisory Given   Pulmonary  Snores at night   Pulmonary exam normal breath sounds clear to auscultation       Cardiovascular negative cardio ROS Normal cardiovascular exam Rhythm:Regular Rate:Normal     Neuro/Psych negative neurological ROS  negative psych ROS   GI/Hepatic negative GI ROS, Neg liver ROS,,,  Endo/Other  diabetes (prediabetic)  BMI 35  Renal/GU negative Renal ROS  negative genitourinary   Musculoskeletal negative musculoskeletal ROS (+)    Abdominal  (+) + obese  Peds  Hematology negative hematology ROS (+)   Anesthesia Other Findings   Reproductive/Obstetrics negative OB ROS                             Anesthesia Physical Anesthesia Plan  ASA: 3  Anesthesia Plan: General   Post-op Pain Management: Tylenol  PO (pre-op)* and Toradol  IV (intra-op)*   Induction: Intravenous  PONV Risk Score and Plan: 4 or greater and Ondansetron , Dexamethasone , Midazolam  and Treatment may vary due to age or medical condition  Airway Management Planned: LMA  Additional Equipment: None  Intra-op Plan:   Post-operative Plan: Extubation in OR  Informed Consent: I have reviewed the patients History and Physical, chart, labs and discussed the procedure including the risks, benefits and alternatives for the proposed anesthesia with the patient or authorized representative who has indicated his/her understanding and acceptance.     Dental advisory given  Plan Discussed with: CRNA  Anesthesia Plan Comments:        Anesthesia Quick Evaluation

## 2023-11-23 NOTE — Discharge Instructions (Signed)
  Post Anesthesia Home Care Instructions  Activity: Get plenty of rest for the remainder of the day. A responsible individual must stay with you for 24 hours following the procedure.  For the next 24 hours, DO NOT: -Drive a car -Advertising copywriter -Drink alcoholic beverages -Take any medication unless instructed by your physician -Make any legal decisions or sign important papers.  Meals: Start with liquid foods such as gelatin or soup. Progress to regular foods as tolerated. Avoid greasy, spicy, heavy foods. If nausea and/or vomiting occur, drink only clear liquids until the nausea and/or vomiting subsides. Call your physician if vomiting continues.  Special Instructions/Symptoms: Your throat may feel dry or sore from the anesthesia or the breathing tube placed in your throat during surgery. If this causes discomfort, gargle with warm salt water. The discomfort should disappear within 24 hours.  May take Tylenol  beginning at 5:30 PM as needed for soreness/discomfort.

## 2023-11-23 NOTE — Transfer of Care (Signed)
 Immediate Anesthesia Transfer of Care Note  Patient: Donna Le  Procedure(s) Performed: DILATATION & CURETTAGE/DIAGNOSTIC HYSTEROSCOPY/POLYPECTOMY  Patient Location: PACU  Anesthesia Type:General  Level of Consciousness: awake, alert , oriented, and patient cooperative  Airway & Oxygen Therapy: Patient Spontanous Breathing  Post-op Assessment: Report given to RN and Post -op Vital signs reviewed and stable  Post vital signs: Reviewed and stable  Last Vitals:  Vitals Value Taken Time  BP 151/87 11/23/23 1435  Temp 36.6 C 11/23/23 1435  Pulse 90 11/23/23 1437  Resp 23 11/23/23 1437  SpO2 96 % 11/23/23 1437  Vitals shown include unfiled device data.  Last Pain:  Vitals:   11/23/23 1120  TempSrc: Oral  PainSc: 0-No pain      Patients Stated Pain Goal: 5 (11/23/23 1120)  Complications: No notable events documented.

## 2023-11-23 NOTE — Interval H&P Note (Signed)
 History and Physical Interval Note:  11/23/2023 1:33 PM  Donna Le  has presented today for surgery, with the diagnosis of cervical polyp.  The various methods of treatment have been discussed with the patient and family. After consideration of risks, benefits and other options for treatment, the patient has consented to  Procedure(s) with comments: DILATATION & CURETTAGE/DIAGNOSTIC HYSTEROSCOPY/POLYPECTOMY (N/A) - MYOSURE as a surgical intervention.  The patient's history has been reviewed, patient examined, no change in status, stable for surgery.  I have reviewed the patient's chart and labs.  Questions were answered to the patient's satisfaction.     Halen Antenucci [name]

## 2023-11-23 NOTE — Anesthesia Postprocedure Evaluation (Signed)
 Anesthesia Post Note  Patient: Donna Le  Procedure(s) Performed: DILATATION & CURETTAGE/DIAGNOSTIC HYSTEROSCOPY/POLYPECTOMY     Patient location during evaluation: PACU Anesthesia Type: General Level of consciousness: awake and alert, oriented and patient cooperative Pain management: pain level controlled Vital Signs Assessment: post-procedure vital signs reviewed and stable Respiratory status: spontaneous breathing, nonlabored ventilation and respiratory function stable Cardiovascular status: blood pressure returned to baseline and stable Postop Assessment: no apparent nausea or vomiting Anesthetic complications: no   No notable events documented.  Last Vitals:  Vitals:   11/23/23 1445 11/23/23 1500  BP: (!) 153/92 (!) 144/83  Pulse: 83 77  Resp: (!) 22 16  Temp:    SpO2: 98% 97%    Last Pain:  Vitals:   11/23/23 1500  TempSrc:   PainSc: 0-No pain                 Jacquelyne Matte

## 2023-11-24 ENCOUNTER — Encounter (HOSPITAL_COMMUNITY): Payer: Self-pay | Admitting: Obstetrics and Gynecology

## 2023-11-24 LAB — SURGICAL PATHOLOGY

## 2023-12-07 ENCOUNTER — Encounter: Payer: Self-pay | Admitting: Obstetrics and Gynecology

## 2023-12-07 ENCOUNTER — Ambulatory Visit (INDEPENDENT_AMBULATORY_CARE_PROVIDER_SITE_OTHER): Admitting: Obstetrics and Gynecology

## 2023-12-07 VITALS — BP 124/78 | HR 63 | Temp 98.3°F | Wt 215.0 lb

## 2023-12-07 DIAGNOSIS — Z09 Encounter for follow-up examination after completed treatment for conditions other than malignant neoplasm: Secondary | ICD-10-CM

## 2023-12-07 NOTE — Progress Notes (Signed)
 68 y.o. [id number] postmenopausal female 2wk s/p diagnostic hysteroscopy, dilation and curettage, polypectomy for cervical polyp, known fibroids here for postop exam. Married. Retired Charity fundraiser.   No issues or concerns today. Pt states she is doing well. Denies N/V, abdominal pain, VB, dysuria. Normal BM and voids.   11/23/23 pathology: A. ENDOMETRIUM, POLYPECTOMY:  - Benign endometrial polyp  - Negative for hyperplasia or malignancy   B. ENDOMETRIUM, CURETTAGE:  - Benign endometrial polyp  - Nonpolypoid benign inactive endometrium  - Negative for hyperplasia or malignancy   GYN HISTORY: No significant history  OB History  Gravida Para Term Preterm AB Living  4 3 3  1 3   SAB IAB Ectopic Multiple Live Births          # Outcome Date GA Lbr Len/2nd Weight Sex Type Anes PTL Lv  4 Term 01/1989    Melven Stable Vag-Spont     3 Term 10/1986    M Vag-Spont     2 Term 08/1979    M      1 AB             Past Medical History:  Diagnosis Date   Allergy    Anemia    Dental crowns present    Fibroid    uterus   Heart murmur    Lipoma of neck 08/2012   right   Pre-diabetes     Past Surgical History:  Procedure Laterality Date   CHOLECYSTECTOMY     COLONOSCOPY     DILATATION & CURRETTAGE/HYSTEROSCOPY WITH RESECTOCOPE N/A 11/23/2023   Procedure: DILATATION & CURETTAGE/DIAGNOSTIC HYSTEROSCOPY/POLYPECTOMY;  Surgeon: Romaine Closs, MD;  Location: [facility] OR;  Service: Gynecology;  Laterality: N/A;  MYOSURE   LIPOMA EXCISION  08/26/2012   Procedure: EXCISION LIPOMA;  Surgeon: Prescott Brodie, MD;  Location: [facility];  Service: ENT;  Laterality: Right;   TUBAL LIGATION  1990    Current Outpatient Medications on File Prior to Visit  Medication Sig Dispense Refill   Cholecalciferol (VITAMIN D3) 2000 units CHEW Chew by mouth.     ibuprofen (ADVIL) 400 MG tablet Take 400 mg by mouth every 6 (six) hours as needed for fever, headache or mild pain (pain score 1-3).     rosuvastatin  (CRESTOR) 20 MG tablet 1 tablet     terbinafine (LAMISIL) 250 MG tablet Take 250 mg by mouth daily. 3 months only     No current facility-administered medications on file prior to visit.    Allergies  Allergen Reactions   Shellfish-Derived Products     Other reaction(s): Unknown      PE Today's Vitals   12/07/23 1431  BP: 124/78  Pulse: 63  Temp: 98.3 F (36.8 C)  TempSrc: Oral  SpO2: 99%  Weight: 215 lb (97.5 kg)    Body mass index is 35.23 kg/m.  Physical Exam Vitals reviewed.  Constitutional:      General: She is not in acute distress.    Appearance: Normal appearance.  HENT:     Head: Normocephalic and atraumatic.     Nose: Nose normal.  Eyes:     Extraocular Movements: Extraocular movements intact.     Conjunctiva/sclera: Conjunctivae normal.  Pulmonary:     Effort: Pulmonary effort is normal.  Abdominal:     General: There is no distension.     Palpations: Abdomen is soft.     Tenderness: There is no abdominal tenderness.  Musculoskeletal:        General:  Normal range of motion.     Cervical back: Normal range of motion.  Neurological:     General: No focal deficit present.     Mental Status: She is alert.  Psychiatric:        Mood and Affect: Mood normal.        Behavior: Behavior normal.      Assessment and Plan:        Postoperative examination  Normal postop exam.  Pathology reviewed with patient. Cleared to return to work. All questions answered.    Romaine Closs, MD

## 2024-06-09 ENCOUNTER — Encounter: Payer: Self-pay | Admitting: *Deleted

## 2024-06-12 NOTE — Progress Notes (Signed)
 "      OFFICE NOTE:    Date:  06/13/2024  ID:  Donna Le, DOB 07-21-1956, MRN [id number] PCP: Dayna Motto, DO  [facility] Providers Cardiologist:  None       Past Medical History - Hypercholesterolemia - Elevated blood pressure - Iron deficiency anemia - Prediabetes - GERD        Discussed the use of AI scribe software for clinical note transcription with the patient, who gave verbal consent to proceed. History of Present Illness Donna Le is a 69 y.o. female is referred by Dayna Motto, DO for further evaluation of chest discomfort.  Approximately one month ago, she experienced substernal chest discomfort while attending a baby shower in [place] . The sensation was described as discomfort rather than pain, accompanied by sweating and weakness, but no shortness of breath. The episode occurred after a long period without eating, which she associates with previous similar experiences. She took Tums, which provided relief. The discomfort was localized to the center of her chest and did not radiate to her neck or arms. The episode lasted about ten minutes and has not recurred since. She had GI eval a few years ago and noted the EGD was normal.  Family history includes her mother having cancer and her father dying from complications of cirrhosis. Her grandmother on her father's side had heart problems. Her oldest son has congestive heart failure, attributed to obesity and smoking.  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 She does not smoke or consume alcohol. She exercises regularly with her church group and reports no chest pain or shortness of breath during exertion. She occasionally experiences mild swelling in her feet, particularly after consuming salty foods.    ROS-See HPI    Studies Reviewed:      LABS - Chart Review  Total cholesterol: 152 (01/26/2024) HDL: 62 (01/26/2024) Triglycerides: 71 (01/26/2024) LDL: 77  (01/26/2024)  DIAGNOSTIC EKG: Normal sinus rhythm, heart rate 64 bpm, normal axis, no ST-T wave changes, QTc 411 ms (05/27/2024)        Physical Exam:  VS:  BP 130/80   Pulse 74   Ht 5' 5.5 (1.664 m)   Wt 220 lb (99.8 kg)   LMP 08/03/2005   SpO2 99%   BMI 36.05 kg/m        Wt Readings from Last 3 Encounters:  06/13/24 220 lb (99.8 kg)  12/07/23 215 lb (97.5 kg)  11/23/23 215 lb (97.5 kg)    Constitutional:      Appearance: Healthy appearance. Not in distress.  Neck:     Vascular: No carotid bruit. JVD normal.  Pulmonary:     Breath sounds: Normal breath sounds. No wheezing. No rales.  Cardiovascular:     Normal rate. Regular rhythm.     Murmurs: There is a systolic murmur at the LLSB.  Edema:    Peripheral edema absent.  Abdominal:     Palpations: Abdomen is soft.       Assessment and Plan:    Assessment & Plan Precordial chest pain Intermittent chest discomfort with episodes of sweating and weakness. Occurred without exertion and was not associated with shortness of breath. Differential includes cardiac etiology (possible atypical presentation of coronary artery disease) and gastrointestinal etiology (gastritis or reflux). No recent exertional symptoms. EKG without acute changes. Risk factors include hyperlipidemia, prediabetes, and borderline hypertension. - Order coronary CTA to rule out obstructive coronary artery disease. - Order echocardiogram to  assess for structural heart disease and evaluate cardiac murmur. - Prescribe metoprolol  tartrate 100 mg x 1 prior to CT scan. - Follow up 3 mos Pure hypercholesterolemia Hyperlipidemia managed with Crestor . LDL was 77 in March 2025. If coronary CTA shows obstructive coronary artery disease or heavy plaque burden, LDL goal may need to be adjusted to less than 70, possibly less than 55. - Continue Crestor  20 mg daily. Murmur - Order echocardiogram to evaluate cardiac murmur and assess for structural heart disease.          Dispo:  Return in about 3 months (around 09/13/2024) for  [employer], MAY USE HEART 1ST FOLLOW UP SLOT.  Signed, Glendia Ferrier, PA-C   "

## 2024-06-13 ENCOUNTER — Encounter: Payer: Self-pay | Admitting: Physician Assistant

## 2024-06-13 ENCOUNTER — Ambulatory Visit: Attending: Physician Assistant | Admitting: Physician Assistant

## 2024-06-13 VITALS — BP 130/80 | HR 74 | Ht 65.5 in | Wt 220.0 lb

## 2024-06-13 DIAGNOSIS — R072 Precordial pain: Secondary | ICD-10-CM | POA: Diagnosis not present

## 2024-06-13 DIAGNOSIS — E7800 Pure hypercholesterolemia, unspecified: Secondary | ICD-10-CM | POA: Diagnosis not present

## 2024-06-13 DIAGNOSIS — R011 Cardiac murmur, unspecified: Secondary | ICD-10-CM | POA: Diagnosis not present

## 2024-06-13 LAB — BASIC METABOLIC PANEL WITH GFR
BUN/Creatinine Ratio: 18 (ref 12–28)
BUN: 17 mg/dL (ref 8–27)
CO2: 23 mmol/L (ref 20–29)
Calcium: 9.5 mg/dL (ref 8.7–10.3)
Chloride: 102 mmol/L (ref 96–106)
Creatinine, Ser: 0.94 mg/dL (ref 0.57–1.00)
Glucose: 106 mg/dL — ABNORMAL HIGH (ref 70–99)
Potassium: 4.8 mmol/L (ref 3.5–5.2)
Sodium: 139 mmol/L (ref 134–144)
eGFR: 66 mL/min/1.73 (ref >59)

## 2024-06-13 MED ORDER — METOPROLOL TARTRATE 100 MG PO TABS: 100.0000 mg | ORAL_TABLET | Freq: Once | ORAL | 0 refills | Status: AC

## 2024-06-13 NOTE — Patient Instructions (Signed)
 Medication Instructions:  Your physician recommends that you continue on your current medications as directed. Please refer to the Current Medication list given to you today.  *If you need a refill on your cardiac medications before your next appointment, please call your pharmacy*  Lab Work: TODAY:  BMET  If you have labs (blood work) drawn today and your tests are completely normal, you will receive your results only by: MyChart Message (if you have MyChart) OR A paper copy in the mail If you have any lab test that is abnormal or we need to change your treatment, we will call you to review the results.  Testing/Procedures: Your physician has requested that you have an echocardiogram. Echocardiography is a painless test that uses sound waves to create images of your heart. It provides your doctor with information about the size and shape of your heart and how well your heart's chambers and valves are working. This procedure takes approximately one hour. There are no restrictions for this procedure. Please do NOT wear cologne, perfume, aftershave, or lotions (deodorant is allowed). Please arrive 15 minutes prior to your appointment time.  Please note: We ask at that you not bring children with you during ultrasound (echo/ vascular) testing. Due to room size and safety concerns, children are not allowed in the ultrasound rooms during exams. Our front office staff cannot provide observation of children in our lobby area while testing is being conducted. An adult accompanying a patient to their appointment will only be allowed in the ultrasound room at the discretion of the ultrasound technician under special circumstances. We apologize for any inconvenience.    Your physician has requested that you have cardiac CT. Cardiac computed tomography (CT) is a painless test that uses an x-ray machine to take clear, detailed pictures of your heart. For further information please visit [email or web address]  Please follow instruction sheet BELOW:    Your cardiac CT will be scheduled at one of the below locations:   [facility] [address] [place], [place] [address] [phone] (Severe contrast allergies only)  OR   [facility] [address] [place], [place] [address] [phone]  OR   [facility] [address] [place], [place] [address] [phone]  OR   [facility] [address]  [place], [place] [address]  OR   [facility] [address] [place], [place] [phone]  If scheduled at [facility], please arrive at the [facility] and Children's Entrance (Entrance C2) of [facility] 30 minutes prior to test start time. You can use the FREE valet parking offered at entrance C (encouraged to control the heart rate for the test)  Proceed to the [facility] Radiology Department (first floor) to check-in and test prep.  All radiology patients and guests should use entrance C2 at [facility], accessed from [facility], even though the hospital's physical address listed is [address].  If scheduled at the Heart and Vascular Tower at [address], please enter the parking lot using the [address] entrance and use the FREE valet service at the patient drop-off area. Enter the building and check-in with registration on the main floor.  If scheduled at [facility], please arrive to the Heart and Vascular Center 15 mins early for check-in and test prep.  There is spacious parking and easy access to the radiology department from the [facility] Heart and Vascular  entrance. Please enter here and check-in with the desk attendant.   If scheduled at [facility], please arrive 30 minutes early for check-in and test prep.  Please follow these instructions carefully (unless otherwise directed):  An IV will be required  for this test and Nitroglycerin will be given.  Hold all erectile dysfunction medications at least 3 days (72 hrs) prior to test. (Ie viagra, cialis, sildenafil, tadalafil, etc)   On the Night Before the Test: Be sure to Drink plenty of water. Do not consume any caffeinated/decaffeinated beverages or chocolate 12 hours prior to your test. Do not take any antihistamines 12 hours prior to your test.   Drink plenty of water until 1 hour prior to the test. Do not eat any food 1 hour prior to test. You may take your regular medications prior to the test.  Take metoprolol (Lopressor) 100 MG two hours prior to test. THIS HAS BEEN SENT TO [facility]- please wear underwire-free bra if available, avoid dresses & tight clothing   After the Test: Drink plenty of water. After receiving IV contrast, you may experience a mild flushed feeling. This is normal. On occasion, you may experience a mild rash up to 24 hours after the test. This is not dangerous. If this occurs, you can take Benadryl 25 mg, Zyrtec, Claritin, or Allegra and increase your fluid intake. (Patients taking Tikosyn should avoid Benadryl, and may take Zyrtec, Claritin, or Allegra) If you experience trouble breathing, this can be serious. If it is severe call 911 IMMEDIATELY. If it is mild, please call our office.  We will call to schedule your test 2-4 weeks out understanding that some insurance companies will need an authorization prior to the service being performed.   For more information and frequently asked questions, please visit our website : [email or web address]  For non-scheduling related questions, please contact the cardiac imaging nurse navigator should you have any questions/concerns: Cardiac Imaging Nurse Navigators Direct Office Dial: [phone]   For scheduling needs, including cancellations and rescheduling, please call Brittany, [phone].   Follow-Up: At [facility], you and  your health needs are our priority.  As part of our continuing mission to provide you with exceptional heart care, our providers are all part of one team.  This team includes your primary Cardiologist (physician) and Advanced Practice Providers or APPs (Physician Assistants and Nurse Practitioners) who all work together to provide you with the care you need, when you need it.  Your next appointment:   3 month(s)  Provider:   Glendia Ferrier, PA-C          We recommend signing up for the patient portal called MyChart.  Sign up information is provided on this After Visit Summary.  MyChart is used to connect with patients for Virtual Visits (Telemedicine).  Patients are able to view lab/test results, encounter notes, upcoming appointments, etc.  Non-urgent messages can be sent to your provider as well.   To learn more about what you can do with MyChart, go to [email or web address].   Other Instructions

## 2024-06-14 ENCOUNTER — Ambulatory Visit: Payer: Self-pay | Admitting: Physician Assistant

## 2024-06-14 DIAGNOSIS — I2510 Atherosclerotic heart disease of native coronary artery without angina pectoris: Secondary | ICD-10-CM

## 2024-06-14 DIAGNOSIS — E7800 Pure hypercholesterolemia, unspecified: Secondary | ICD-10-CM

## 2024-06-20 NOTE — Telephone Encounter (Signed)
 Lvm of results and recommendations of keeping follow up appointments with clinic number if any questions or concerns

## 2024-06-30 ENCOUNTER — Encounter (HOSPITAL_COMMUNITY): Payer: Self-pay

## 2024-07-05 ENCOUNTER — Ambulatory Visit (HOSPITAL_COMMUNITY)
Admission: RE | Admit: 2024-07-05 | Discharge: 2024-07-05 | Disposition: A | Source: Ambulatory Visit | Attending: Physician Assistant | Admitting: Physician Assistant

## 2024-07-05 DIAGNOSIS — R072 Precordial pain: Secondary | ICD-10-CM | POA: Diagnosis present

## 2024-07-05 DIAGNOSIS — E7800 Pure hypercholesterolemia, unspecified: Secondary | ICD-10-CM | POA: Diagnosis present

## 2024-07-05 MED ORDER — NITROGLYCERIN 0.4 MG SL SUBL
0.8000 mg | SUBLINGUAL_TABLET | Freq: Once | SUBLINGUAL | Status: AC
  Administered 2024-07-05: 0.8 mg via SUBLINGUAL

## 2024-07-05 MED ORDER — IOHEXOL 350 MG/ML SOLN
100.0000 mL | Freq: Once | INTRAVENOUS | Status: AC | PRN
  Administered 2024-07-05: 100 mL via INTRAVENOUS

## 2024-07-10 ENCOUNTER — Encounter: Payer: Self-pay | Admitting: Physician Assistant

## 2024-07-10 DIAGNOSIS — I2510 Atherosclerotic heart disease of native coronary artery without angina pectoris: Secondary | ICD-10-CM | POA: Insufficient documentation

## 2024-07-11 MED ORDER — ROSUVASTATIN CALCIUM 20 MG PO TABS: ORAL_TABLET | ORAL | 3 refills | Status: AC

## 2024-07-11 NOTE — Addendum Note (Signed)
 Addended byBETHA FERRIER, GLENDIA T on: 07/11/2024 05:22 PM   Modules accepted: Orders

## 2024-07-21 ENCOUNTER — Ambulatory Visit (HOSPITAL_COMMUNITY)
Admission: RE | Admit: 2024-07-21 | Discharge: 2024-07-21 | Disposition: A | Discharge status: Home / Self Care | Source: Ambulatory Visit | Attending: Cardiovascular Disease | Admitting: Cardiovascular Disease

## 2024-07-21 DIAGNOSIS — R072 Precordial pain: Secondary | ICD-10-CM | POA: Diagnosis not present

## 2024-07-21 DIAGNOSIS — R011 Cardiac murmur, unspecified: Secondary | ICD-10-CM | POA: Diagnosis present

## 2024-07-21 LAB — ECHOCARDIOGRAM COMPLETE
Area-P 1/2: 4.19 cm2
S' Lateral: 3.2 cm

## 2024-09-12 ENCOUNTER — Ambulatory Visit: Admitting: Physician Assistant
